# Patient Record
Sex: Female | Born: 1950 | Race: White | Hispanic: No | Marital: Married | Smoking: Never smoker
Health system: Southern US, Community
[De-identification: ages and names within clinical notes are randomized; demographics above are authoritative.]

## PROBLEM LIST (undated history)

## (undated) DIAGNOSIS — F329 Major depressive disorder, single episode, unspecified: Secondary | ICD-10-CM

## (undated) DIAGNOSIS — H269 Unspecified cataract: Secondary | ICD-10-CM

## (undated) DIAGNOSIS — K5900 Constipation, unspecified: Secondary | ICD-10-CM

## (undated) DIAGNOSIS — Z860100 Personal history of colon polyps, unspecified: Secondary | ICD-10-CM

## (undated) DIAGNOSIS — M62838 Other muscle spasm: Secondary | ICD-10-CM

## (undated) DIAGNOSIS — D649 Anemia, unspecified: Secondary | ICD-10-CM

## (undated) DIAGNOSIS — G971 Other reaction to spinal and lumbar puncture: Secondary | ICD-10-CM

## (undated) DIAGNOSIS — M549 Dorsalgia, unspecified: Secondary | ICD-10-CM

## (undated) DIAGNOSIS — R51 Headache: Secondary | ICD-10-CM

## (undated) DIAGNOSIS — R112 Nausea with vomiting, unspecified: Secondary | ICD-10-CM

## (undated) DIAGNOSIS — Z8619 Personal history of other infectious and parasitic diseases: Secondary | ICD-10-CM

## (undated) DIAGNOSIS — Z9889 Other specified postprocedural states: Secondary | ICD-10-CM

## (undated) DIAGNOSIS — F419 Anxiety disorder, unspecified: Secondary | ICD-10-CM

## (undated) DIAGNOSIS — F32A Depression, unspecified: Secondary | ICD-10-CM

## (undated) DIAGNOSIS — Z9289 Personal history of other medical treatment: Secondary | ICD-10-CM

## (undated) DIAGNOSIS — M1990 Unspecified osteoarthritis, unspecified site: Secondary | ICD-10-CM

## (undated) DIAGNOSIS — J302 Other seasonal allergic rhinitis: Secondary | ICD-10-CM

## (undated) DIAGNOSIS — R519 Headache, unspecified: Secondary | ICD-10-CM

## (undated) DIAGNOSIS — G8929 Other chronic pain: Secondary | ICD-10-CM

## (undated) HISTORY — PX: COLPOSCOPY: SHX161

## (undated) HISTORY — PX: SPINE SURGERY: SHX786

## (undated) HISTORY — PX: COLONOSCOPY: SHX174

## (undated) HISTORY — PX: ESOPHAGOGASTRODUODENOSCOPY: SHX1529

---
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
## 1977-03-08 DIAGNOSIS — G971 Other reaction to spinal and lumbar puncture: Secondary | ICD-10-CM

## 1977-03-08 HISTORY — DX: Other reaction to spinal and lumbar puncture: G97.1

## 1979-03-09 HISTORY — PX: TUBAL LIGATION: SHX77

## 1983-03-09 HISTORY — PX: ANAL FISSURE REPAIR: SHX2312

## 1998-02-18 ENCOUNTER — Encounter: Payer: Self-pay | Admitting: Internal Medicine

## 1998-02-18 ENCOUNTER — Ambulatory Visit (HOSPITAL_COMMUNITY): Admission: RE | Admit: 1998-02-18 | Discharge: 1998-02-18 | Payer: Self-pay | Admitting: Internal Medicine

## 1998-05-14 ENCOUNTER — Ambulatory Visit (HOSPITAL_COMMUNITY): Admission: RE | Admit: 1998-05-14 | Discharge: 1998-05-14 | Payer: Self-pay | Admitting: Gastroenterology

## 1998-09-26 ENCOUNTER — Ambulatory Visit (HOSPITAL_COMMUNITY): Admission: RE | Admit: 1998-09-26 | Discharge: 1998-09-26 | Payer: Self-pay | Admitting: Internal Medicine

## 1999-01-22 ENCOUNTER — Encounter: Admission: RE | Admit: 1999-01-22 | Discharge: 1999-01-22 | Payer: Self-pay | Admitting: Internal Medicine

## 1999-01-22 ENCOUNTER — Encounter: Payer: Self-pay | Admitting: Internal Medicine

## 1999-12-11 ENCOUNTER — Encounter: Admission: RE | Admit: 1999-12-11 | Discharge: 1999-12-11 | Payer: Self-pay | Admitting: Sports Medicine

## 2000-12-16 ENCOUNTER — Encounter: Payer: Self-pay | Admitting: Internal Medicine

## 2000-12-16 ENCOUNTER — Ambulatory Visit (HOSPITAL_COMMUNITY): Admission: RE | Admit: 2000-12-16 | Discharge: 2000-12-16 | Payer: Self-pay | Admitting: Internal Medicine

## 2001-07-25 ENCOUNTER — Encounter: Admission: RE | Admit: 2001-07-25 | Discharge: 2001-07-25 | Payer: Self-pay | Admitting: Internal Medicine

## 2001-08-08 ENCOUNTER — Encounter: Payer: Self-pay | Admitting: Internal Medicine

## 2001-08-08 ENCOUNTER — Encounter: Admission: RE | Admit: 2001-08-08 | Discharge: 2001-08-08 | Payer: Self-pay | Admitting: Internal Medicine

## 2002-05-04 ENCOUNTER — Encounter: Payer: Self-pay | Admitting: Internal Medicine

## 2002-05-04 ENCOUNTER — Ambulatory Visit (HOSPITAL_COMMUNITY): Admission: RE | Admit: 2002-05-04 | Discharge: 2002-05-04 | Payer: Self-pay | Admitting: Internal Medicine

## 2002-09-18 ENCOUNTER — Encounter: Payer: Self-pay | Admitting: Internal Medicine

## 2002-09-18 ENCOUNTER — Ambulatory Visit (HOSPITAL_COMMUNITY): Admission: RE | Admit: 2002-09-18 | Discharge: 2002-09-18 | Payer: Self-pay | Admitting: Internal Medicine

## 2003-10-15 ENCOUNTER — Encounter: Admission: RE | Admit: 2003-10-15 | Discharge: 2003-10-15 | Payer: Self-pay | Admitting: Internal Medicine

## 2003-10-16 ENCOUNTER — Ambulatory Visit: Admission: RE | Admit: 2003-10-16 | Discharge: 2003-10-16 | Payer: Self-pay | Admitting: Internal Medicine

## 2003-10-18 ENCOUNTER — Encounter: Admission: RE | Admit: 2003-10-18 | Discharge: 2003-10-18 | Payer: Self-pay | Admitting: Internal Medicine

## 2003-11-01 ENCOUNTER — Encounter: Admission: RE | Admit: 2003-11-01 | Discharge: 2003-11-01 | Payer: Self-pay | Admitting: Internal Medicine

## 2003-11-15 ENCOUNTER — Encounter: Admission: RE | Admit: 2003-11-15 | Discharge: 2004-02-13 | Payer: Self-pay | Admitting: Neurological Surgery

## 2003-11-18 ENCOUNTER — Encounter: Admission: RE | Admit: 2003-11-18 | Discharge: 2003-11-18 | Payer: Self-pay | Admitting: Internal Medicine

## 2004-01-07 ENCOUNTER — Inpatient Hospital Stay (HOSPITAL_COMMUNITY): Admission: RE | Admit: 2004-01-07 | Discharge: 2004-01-10 | Payer: Self-pay | Admitting: Neurological Surgery

## 2004-11-05 ENCOUNTER — Ambulatory Visit: Payer: Self-pay | Admitting: Internal Medicine

## 2005-02-12 ENCOUNTER — Ambulatory Visit (HOSPITAL_COMMUNITY): Admission: RE | Admit: 2005-02-12 | Discharge: 2005-02-12 | Payer: Self-pay | Admitting: Gastroenterology

## 2005-02-12 ENCOUNTER — Encounter (INDEPENDENT_AMBULATORY_CARE_PROVIDER_SITE_OTHER): Payer: Self-pay | Admitting: Specialist

## 2005-11-29 ENCOUNTER — Encounter: Admission: RE | Admit: 2005-11-29 | Discharge: 2005-11-29 | Payer: Self-pay | Admitting: Occupational Medicine

## 2007-01-30 ENCOUNTER — Encounter: Admission: RE | Admit: 2007-01-30 | Discharge: 2007-01-30 | Payer: Self-pay | Admitting: Internal Medicine

## 2008-02-27 ENCOUNTER — Encounter: Admission: RE | Admit: 2008-02-27 | Discharge: 2008-02-27 | Payer: Self-pay | Admitting: Internal Medicine

## 2008-12-26 ENCOUNTER — Ambulatory Visit (HOSPITAL_COMMUNITY): Admission: RE | Admit: 2008-12-26 | Discharge: 2008-12-26 | Payer: Self-pay | Admitting: Orthopedic Surgery

## 2009-12-10 ENCOUNTER — Emergency Department (HOSPITAL_COMMUNITY): Admission: EM | Admit: 2009-12-10 | Discharge: 2009-12-10 | Payer: Self-pay | Admitting: Family Medicine

## 2010-03-03 ENCOUNTER — Ambulatory Visit: Payer: Self-pay | Admitting: Internal Medicine

## 2010-03-03 DIAGNOSIS — F329 Major depressive disorder, single episode, unspecified: Secondary | ICD-10-CM

## 2010-03-03 DIAGNOSIS — E039 Hypothyroidism, unspecified: Secondary | ICD-10-CM | POA: Insufficient documentation

## 2010-03-03 DIAGNOSIS — E785 Hyperlipidemia, unspecified: Secondary | ICD-10-CM

## 2010-03-03 DIAGNOSIS — M8580 Other specified disorders of bone density and structure, unspecified site: Secondary | ICD-10-CM

## 2010-03-03 DIAGNOSIS — D509 Iron deficiency anemia, unspecified: Secondary | ICD-10-CM | POA: Insufficient documentation

## 2010-03-03 DIAGNOSIS — R7309 Other abnormal glucose: Secondary | ICD-10-CM

## 2010-03-03 DIAGNOSIS — G4700 Insomnia, unspecified: Secondary | ICD-10-CM

## 2010-03-03 DIAGNOSIS — J309 Allergic rhinitis, unspecified: Secondary | ICD-10-CM

## 2010-03-03 LAB — CONVERTED CEMR LAB
BUN: 9 mg/dL (ref 6–23)
Basophils Absolute: 0 10*3/uL (ref 0.0–0.1)
Basophils Relative: 1 % (ref 0–1)
CO2: 29 meq/L (ref 19–32)
Calcium: 10 mg/dL (ref 8.4–10.5)
Chloride: 104 meq/L (ref 96–112)
Creatinine, Ser: 0.7 mg/dL (ref 0.40–1.20)
Eosinophils Absolute: 0.1 10*3/uL (ref 0.0–0.7)
Eosinophils Relative: 2 % (ref 0–5)
Ferritin: 14 ng/mL (ref 10–291)
Free T4: 0.97 ng/dL (ref 0.80–1.80)
Glucose, Bld: 96 mg/dL (ref 70–99)
HCT: 37.1 % (ref 36.0–46.0)
Hemoglobin: 12.1 g/dL (ref 12.0–15.0)
Hgb A1c MFr Bld: 6 %
Lymphocytes Relative: 41 % (ref 12–46)
Lymphs Abs: 1.5 10*3/uL (ref 0.7–4.0)
MCHC: 32.6 g/dL (ref 30.0–36.0)
MCV: 88.3 fL (ref 78.0–100.0)
Monocytes Absolute: 0.4 10*3/uL (ref 0.1–1.0)
Monocytes Relative: 9 % (ref 3–12)
Neutro Abs: 1.8 10*3/uL (ref 1.7–7.7)
Neutrophils Relative %: 47 % (ref 43–77)
Platelets: 235 10*3/uL (ref 150–400)
Potassium: 4.8 meq/L (ref 3.5–5.3)
RBC: 4.2 M/uL (ref 3.87–5.11)
RDW: 14.9 % (ref 11.5–15.5)
Sodium: 142 meq/L (ref 135–145)
TSH: 3.174 microintl units/mL (ref 0.350–4.50)
Vit D, 25-Hydroxy: 41 ng/mL (ref 30–89)
Vitamin B-12: 554 pg/mL (ref 211–911)
WBC: 3.8 10*3/uL — ABNORMAL LOW (ref 4.0–10.5)

## 2010-03-10 ENCOUNTER — Encounter: Payer: Self-pay | Admitting: Internal Medicine

## 2010-03-12 ENCOUNTER — Ambulatory Visit (HOSPITAL_COMMUNITY)
Admission: RE | Admit: 2010-03-12 | Discharge: 2010-03-12 | Payer: Self-pay | Source: Home / Self Care | Attending: Internal Medicine | Admitting: Internal Medicine

## 2010-03-25 ENCOUNTER — Ambulatory Visit: Admission: RE | Admit: 2010-03-25 | Discharge: 2010-03-25 | Payer: Self-pay | Source: Home / Self Care

## 2010-03-25 ENCOUNTER — Encounter: Payer: Self-pay | Admitting: Internal Medicine

## 2010-03-25 LAB — CONVERTED CEMR LAB: Total CHOL/HDL Ratio: 3.3

## 2010-03-29 ENCOUNTER — Encounter: Payer: Self-pay | Admitting: Internal Medicine

## 2010-04-09 NOTE — Miscellaneous (Signed)
Summary: PATIENT CONSENT FORM  PATIENT CONSENT FORM   Imported By: Louretta Parma 03/05/2010 16:35:03  _____________________________________________________________________  External Attachment:    Type:   Image     Comment:   External Document

## 2010-04-09 NOTE — Assessment & Plan Note (Signed)
Summary: NEW PT/DS   Vital Signs:  Patient profile:   60 year old female Height:      64.5 inches Weight:      159.5 pounds BMI:     27.05 Temp:     98.1 degrees F oral Pulse rate:   68 / minute BP sitting:   155 / 67  (right arm)  Vitals Entered By: Filomena Jungling NT II (03/17/10 1:28 PM) Is Patient Diabetic? No Pain Assessment Patient in pain? no      Nutritional Status BMI of 25 - 29 = overweight  Have you ever been in a relationship where you felt threatened, hurt or afraid?No   Does patient need assistance? Functional Status Self care Ambulation Normal   History of Present Illness: Sharon Barrett is a 60 year old woman who is well known to me through the [facility] Teaching program who comes in today to establish care with me as her new PCP. She has several concerns triggered by her [facility] health screen and would like to have them evaluated. She has few specific or worrisome complaints today that need to be addressed other than insomnia, mild chronic rhinitis, medication clarifications and preventive care needs.  Preventive Screening-Counseling & Management  Alcohol-Tobacco     Smoking Status: never  Current Medications (verified): 1)  Venlafaxine Hcl 37.5 Mg Xr24h-Cap (Venlafaxine Hcl) .... Take 1 Tablet By Mouth Once A Day 2)  Claritin-D 24 Hour 10-240 Mg Xr24h-Tab (Loratadine-Pseudoephedrine) .... Take 1 Tablet By Mouth Once A Day As Needed For Congestion 3)  Colace 100 Mg Caps (Docusate Sodium) .... Take 1 Tablet By Mouth Two Times A Day As Needed For Constipation 4)  Aspirin 81 Mg Tabs (Aspirin) .... Take 1 Tablet By Mouth Once A Day 5)  Omega-3 Cf 1000 Mg Caps (Omega-3 Fatty Acids) .... 1200mg  Tablet By Mouth Once Daily 6)  Flonase 50 Mcg/act Susp (Fluticasone Propionate) .... One Spray in Each Nostril Twice Daily As Needed For Congestion 7)  Os-Cal 500 + D 500-200 Mg-Unit Tabs (Calcium Carbonate-Vitamin D) .... Take 1 Tablet By Mouth Two Times A Day 8)  Klonopin 0.5  Mg Tabs (Clonazepam) .... Take 1 Tablet By Mouth Once A Day At Bedtime As Needed For Sleep 9)  Simvastatin 20 Mg Tabs (Simvastatin) .... Take 1 Tablet By Mouth Once A Day At Bedtime  Allergies (verified): 1)  ! 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 [employer] Faith Lives in [place] with her husband Three children  Past Medical History: Lumbar Fusion 1993 for Unstable Spondylolisthesis Anal Fissure with complicated peri-rectal abscess 1985 Bladder Stricture in 1968, repaired Colposcopy for Abnormal PAP in 1980's Three Normal Childbirth/Deliveries Tonsillectomy in 1956  Past Surgical History: see PMH  Family History: Father died at 80 Heart Attack Mother died 98 Azhiemer Disease, Hypertension Siblings 2 older sisters: Sarcoidosis 2 Aunts with Lung Cancer  Social History: Never smoker No ETOH- occasionally No illicit drugt history 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   Head:  Normocephalic and atraumatic without obvious abnormalities. No apparent alopecia or balding. Eyes:  vision grossly intact.   Nose:  no external deformity and no nasal discharge.   Mouth:  good dentition and pharynx pink and moist.   Neck:  supple, no masses, no thyromegaly, no thyroid nodules or tenderness, no JVD, no carotid bruits, and no cervical  lymphadenopathy.   Chest Wall:  no deformities.   Lungs:  normal respiratory effort, normal breath sounds, and no wheezes.   Heart:  normal rate, regular rhythm, no murmur, no gallop, no rub, no JVD, and PMI normal.   Abdomen:  soft and non-tender.   Msk:  no joint tenderness and no joint swelling.   Pulses:  R radial normal, R dorsalis pedis normal, L radial normal, and L posterior tibial normal.   Extremities:  No clubbing, cyanosis, edema, or deformity noted with normal full range of motion of all joints.   Neurologic:  No cranial nerve deficits noted. Station and gait are normal. Plantar reflexes are down-going bilaterally. Sensory, motor and coordinative functions appear intact. Skin:  color normal and no rashes.   Cervical Nodes:  no anterior cervical adenopathy.   Psych:  Oriented X3, memory intact for recent and remote, normally interactive, and good eye contact.     Impression & Recommendations:  Problem # 1:  DEPRESSION (ICD-311) Will continue her current medications. No known side effects. Discussed current state of depression which she believes is well controlled. Some issues with insomnia discussed below.  Her updated medication list for this problem includes:    Venlafaxine Hcl 37.5 Mg Xr24h-cap (Venlafaxine hcl) .Marland Kitchen... Take 1 tablet by mouth once a day    Klonopin 0.5 Mg Tabs (Clonazepam) .Marland Kitchen... Take 1 tablet by mouth once a day at bedtime as needed for sleep  Problem # 2:  INSOMNIA (ICD-780.52) Some concerns today about chronic insomnia. Primary difficulty with sleep maintence and sometimes with sleep initation- feels it is strongly related to anxiety and "racing thoughts" -feels internal restlesness and cannot calm her mind to achieve sleep. We discussed sleep hygiene-which she has a very good understand of this. I also shared with her a concern about taking nasal decongestants with Psuedophed in them which is a stimulant and may be contributing to the restlessness. We will try  low dose klonopin at bedtiome and see if this helps for now. If she continues to have issues I will probably recommend a sleepy study.  Problem # 3:  ANEMIA (ICD-285.9) Will repeat CBC, slightly low on her health screen. She is also due for her colonoscopy- will have her set this up with Dr. Loreta Ave for later in January. Will also check B12 and ferritin.  Orders: T-CBC w/Diff [phone]) T-Ferritin [phone]) T-Vitamin B12 ([id number])  Problem # 4:  FASTING HYPERGLYCEMIA (ICD-790.29) Again, slightly high on health screen-will check an A1C to make sure she doesnt have pre-DM.  Orders: T-Basic Metabolic Panel [phone]) T- Hemoglobin A1C ([id number])  Problem # 5:  HYPOTHYROIDISM (ICD-244.9) TSH elevated on health screen- will repeat and check a T4.  Orders: T-T4, Free [id number]) T-TSH [phone])  Problem # 6:  ALLERGIC RHINITIS (ICD-477.9) Encouraged her to reduce her use of OTC "D" drugs ok to use regular Zyrtec. Continue with Flonase.   Her updated medication list for this problem includes:    Flonase 50 Mcg/act Susp (Fluticasone propionate) ..... One spray in each nostril twice  daily as needed for congestion  Problem # 7:  OSTEOPENIA (ICD-733.90) Will check her Vitamin D level. Will order DEXA at next visit- she reports a DEXA within the last 5 years.  Orders: T-Vitamin D (25-Hydroxy) [id number])  Problem # 8:  Preventive Health Care (ICD-V70.0) Due for mammogram. Will place order. Await labs and I will update flowsheet.  Problem # 9:  HYPERLIPIDEMIA (ICD-272.4) LDL 135 on health screen, we discussed the benefits of a statin and will start today. Will check LFTs in 6-8 weeks.  Her updated medication list for this problem includes:    Simvastatin 20 Mg Tabs (Simvastatin) .Marland Kitchen... Take 1 tablet by mouth once a day at bedtime  Complete Medication List: 1)  Venlafaxine Hcl 37.5 Mg Xr24h-cap (Venlafaxine hcl) .... Take 1 tablet by mouth once a day 2)   Claritin-d 24 Hour 10-240 Mg Xr24h-tab (Loratadine-pseudoephedrine) .... Take 1 tablet by mouth once a day as needed for congestion 3)  Colace 100 Mg Caps (Docusate sodium) .... Take 1 tablet by mouth two times a day as needed for constipation 4)  Aspirin 81 Mg Tabs (Aspirin) .... Take 1 tablet by mouth once a day 5)  Omega-3 Cf 1000 Mg Caps (Omega-3 fatty acids) .... 1200mg  tablet by mouth once daily 6)  Flonase 50 Mcg/act Susp (Fluticasone propionate) .... One spray in each nostril twice daily as needed for congestion 7)  Os-cal 500 + D 500-200 Mg-unit Tabs (Calcium carbonate-vitamin d) .... Take 1 tablet by mouth two times a day 8)  Klonopin 0.5 Mg Tabs (Clonazepam) .... Take 1 tablet by mouth once a day at bedtime as needed for sleep 9)  Simvastatin 20 Mg Tabs (Simvastatin) .... Take 1 tablet by mouth once a day at bedtime  Other Orders: Mammogram (Screening) (Mammo)  Patient Instructions: 1)  I will call you to discuss lab results. 2)  F/U in 6 months or as needed. Prescriptions: SIMVASTATIN 20 MG TABS (SIMVASTATIN) Take 1 tablet by mouth once a day at bedtime  #30 x 0   Entered and Authorized by:   Julaine Fusi  DO   Signed by:   Julaine Fusi  DO on 03/03/2010   Method used:   Electronically to        [facility]* (retail)       [address]       [address] Shipping/mailing       [place], [place]  [address]       Ph: [phone]       Fax: [phone]   RxID:   [id number] KLONOPIN 0.5 MG TABS (CLONAZEPAM) Take 1 tablet by mouth once a day at bedtime as needed for sleep  #30 x 0   Entered and Authorized by:   Julaine Fusi  DO   Signed by:   Julaine Fusi  DO on 03/03/2010   Method used:   Print then Give to Patient   RxID:   [id number]    Orders Added: 1)  T-Basic Metabolic Panel [[id number]] 2)  T-CBC w/Diff [[id number]] 3)  T- Hemoglobin A1C [83036-23375] 4)  T-T4, Free [[id number]] 5)  T-TSH [[id number]] 6)  T-Ferritin  [[id number]] 7)  T-Vitamin B12 [82607-23330] 8)  T-Vitamin D (25-Hydroxy) [76283-15176] 9)  Mammogram (Screening) [Mammo] 10)  New Patient Level V [99205]    Process Orders Check Orders Results:     [facility]: ABN not required for this insurance Tests Sent for requisitioning (March 08, 2010 10:50 PM):  03/03/2010: [facility] -- T-Basic Metabolic Panel [phone] (signed)     03/03/2010: [facility] -- T-CBC w/Diff [[id number]] (signed)     03/03/2010: [facility] -- T- Hemoglobin A1C [[id number]] (signed)     03/03/2010: [facility] -- T-T4, [place] [[id number]] (signed)     03/03/2010: [facility] -- T-TSH [phone] (signed)     03/03/2010: [facility] -- T-Ferritin [phone] (signed)     03/03/2010: [facility] -- T-Vitamin B12 [phone] (signed)     03/03/2010: [facility] -- T-Vitamin D (25-Hydroxy) [phone] (signed)     Prevention & Chronic Care Immunizations   Influenza vaccine: Not documented    Tetanus booster: Not documented    Pneumococcal vaccine: Not documented    H. zoster vaccine: Not documented    Immunization comments: PNA done at 60, shingles vac at 58  Colorectal Screening   Hemoccult: Not documented    Colonoscopy: Not documented  Other Screening   Pap smear: Not documented   Pap smear action/deferral: Not indicated-other  (03/03/2010)    Mammogram: Not documented   Mammogram action/deferral: Ordered  (03/03/2010)    DXA bone density scan: Not documented   Smoking status: never  (03/03/2010)  Lipids   Total Cholesterol: Not documented   LDL: Not documented   LDL Direct: Not documented   HDL: Not documented   Triglycerides: Not documented    SGOT (AST): Not documented   SGPT (ALT): Not documented   Alkaline phosphatase: Not documented   Total bilirubin: Not  documented  Self-Management Support :    Lipid self-management support: Not documented    Nursing Instructions: Schedule screening mammogram (see order)   Laboratory Results   Blood Tests   Date/Time Received: March 03, 2010 3:08 PM Date/Time Reported: Burke Keels  March 03, 2010 3:08 PM   HGBA1C: 6.0%   (Normal Range: Non-Diabetic - 3-6%   Control Diabetic - 6-8%)

## 2010-04-09 NOTE — Miscellaneous (Signed)
  Clinical Lists Changes  Orders: Added new Test order of T-Lipid Profile [phone]) - Signed     Process Orders Check Orders Results:     [facility]: ABN not required for this insurance Order queued for requisitioning for [facility]: March 10, 2010 8:09 PM  Tests Sent for requisitioning (March 19, 2010 10:43 PM):     03/10/2010: [facility] -- T-Lipid Profile [phone] (signed)

## 2010-04-14 ENCOUNTER — Encounter: Payer: Self-pay | Admitting: Internal Medicine

## 2010-04-14 ENCOUNTER — Other Ambulatory Visit: Payer: Self-pay | Admitting: *Deleted

## 2010-04-15 MED ORDER — CLONAZEPAM 0.5 MG PO TABS: 0.5000 mg | ORAL_TABLET | Freq: Two times a day (BID) | ORAL | Status: DC | PRN

## 2010-04-15 NOTE — Telephone Encounter (Signed)
Rx called in 

## 2010-04-16 ENCOUNTER — Telehealth: Payer: Self-pay | Admitting: *Deleted

## 2010-04-16 ENCOUNTER — Other Ambulatory Visit: Payer: Self-pay | Admitting: *Deleted

## 2010-04-16 NOTE — Telephone Encounter (Signed)
Pt called and ask if her refill had been called in for clonazepam, i [facility]

## 2010-04-16 NOTE — Telephone Encounter (Signed)
Med has been refilled.

## 2010-04-27 ENCOUNTER — Other Ambulatory Visit: Payer: Self-pay | Admitting: *Deleted

## 2010-04-27 MED ORDER — FLUTICASONE PROPIONATE 50 MCG/ACT NA SUSP: 1.0000 | Freq: Two times a day (BID) | NASAL | Status: DC | PRN

## 2010-05-12 ENCOUNTER — Other Ambulatory Visit: Payer: Self-pay | Admitting: Internal Medicine

## 2010-05-12 MED ORDER — CIPROFLOXACIN-HYDROCORTISONE 0.2-1 % OT SUSP: 4.0000 [drp] | Freq: Two times a day (BID) | OTIC | Status: AC

## 2010-05-12 NOTE — Progress Notes (Signed)
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 5 days of R ear pain, dry cough, tenderness over frontal sinuses, scratchy throat. No fever, dyspnea. Never gets sick. Had to leave work which she never has to do. Use OTC to treat sxs. Exam NAD, R TM erythematous over anterior canal, TM nl. No LAD. Throat nl. Lungs clear.   A/P Likely Viral URI with R external otitis. Cont symptomatic tx. Cipro ear drops.

## 2010-07-03 ENCOUNTER — Other Ambulatory Visit: Payer: Self-pay | Admitting: Gastroenterology

## 2010-07-03 ENCOUNTER — Ambulatory Visit (HOSPITAL_COMMUNITY)
Admission: RE | Admit: 2010-07-03 | Discharge: 2010-07-03 | Disposition: A | Discharge status: Home / Self Care | Payer: 59 | Source: Ambulatory Visit | Attending: Gastroenterology | Admitting: Gastroenterology

## 2010-07-03 DIAGNOSIS — K5520 Angiodysplasia of colon without hemorrhage: Secondary | ICD-10-CM | POA: Insufficient documentation

## 2010-07-03 DIAGNOSIS — D126 Benign neoplasm of colon, unspecified: Secondary | ICD-10-CM | POA: Insufficient documentation

## 2010-07-03 DIAGNOSIS — Z09 Encounter for follow-up examination after completed treatment for conditions other than malignant neoplasm: Secondary | ICD-10-CM | POA: Insufficient documentation

## 2010-07-03 DIAGNOSIS — K5900 Constipation, unspecified: Secondary | ICD-10-CM | POA: Insufficient documentation

## 2010-07-03 DIAGNOSIS — K5730 Diverticulosis of large intestine without perforation or abscess without bleeding: Secondary | ICD-10-CM | POA: Insufficient documentation

## 2010-07-03 DIAGNOSIS — K648 Other hemorrhoids: Secondary | ICD-10-CM | POA: Insufficient documentation

## 2010-07-24 NOTE — Op Note (Signed)
Sharon Barrett, Sharon Barrett                ACCOUNT NO.:  [id number]   MEDICAL RECORD NO.:  [id number]          PATIENT TYPE:  INP   LOCATION:  3013                         FACILITY:  [facility]   PHYSICIAN:  Stefani Dama, M.D.  DATE OF BIRTH:  [birth year]   DATE OF PROCEDURE:  01/07/2004  DATE OF DISCHARGE:                                 OPERATIVE REPORT   PREOPERATIVE DIAGNOSIS:  L4, L5 spondylolisthesis with stenosis and lumbar  radiculopathy.   POSTOPERATIVE DIAGNOSIS:  L4, L5 spondylolisthesis with stenosis and lumbar  radiculopathy.   OPERATION:  1.  Bilateral lumbar laminotomy and diskectomy, L4-5.  2.  Posterior lumbar interbody arthrodesis with PEEK interbody spacers,      allograft and autograft arthrodesis, and posterior fusion with autograft      and allograft, nonsegmental pedicular fixation, L4-5.   SURGEON:  Stefani Dama, M.D.   FIRST ASSISTANT:  Danae Orleans. Venetia Maxon, M.D.   ANESTHESIA:  General endotracheal.   INDICATIONS:  The patient is a 60 year old individual who has had  significant back and bilateral lower extremity pain, worse on the left side  than the right side.  She has evidence of a degenerative listhesis at the L4-  5 level, where she has had a previous diskectomy.  She is now to undergo  surgical decompression and stabilization at L4-5.   PROCEDURE:  The patient was brought to the operating room supine on the  stretcher.  After the smooth induction of general endotracheal anesthesia,  she was turned prone.  The back was shaved, prepped with Duraprep, and  draped in a sterile fashion.  The previously-made incision was reopened with  the midline incision, and this was carried down to the lumbar dorsal fascia,  which was opened on either side of midline to expose the spinous processes  of L3, L4, and L5.  A localizing radiograph was obtained with towel hooks  around the L3 and the L5 spinous processes.  Interlaminar space at L5-S1 was  then dissected down and  the muscle bellies were retracted out to either  side.  A self-retaining retractor was placed in the wound and care was taken  to then dissect the area around the facet joint at L5-S1.  Dissection was  carried up superiorly to expose the transverse processes of L4.  The  interlaminar space thus being decompressed, care was taken then to enlarge  the laminotomy at L4-5, first on the right side, thus exposing the common  dural tube and the L5 nerve root and protecting it carefully.  Epidural  fibrosis that was plentiful around this area was carefully dissected away  and divided so as to decompress the common dural tube and the L5 nerve root  on that right side.  The disk space was identified, and then the disk space  was opened with a 15 blade.  There was noted to be a spondylolisthetic  segment here approximately 4-5 mm of anterolisthesis of L4 on L5.  Care was  taken to dissect away the tissue to allow for further mobilization of the  vertebrae.  Once this was  accomplished, the procedure was repeated on the  left side.  Here there was noted to be substantially more spondylitic change  in the facet joint itself and this facet joint largely required to be  sacrificed in order to gain good decompression of the left-sided nerve  roots.  Once this was accomplished, hemostasis in the epidural spaces was  achieved.  Diskectomy was completed at L4-5 on that left side.  A series of  interbody spreaders was then used to distract the disk space so as to  increase the height of the disk space and also to allow for some reduction  of the spondylolisthetic segment.  A series of __________ type disk space  rongeurs was used to enlarge the interspace from initially the 6 mm up to  the 9 mm size.  This would allow for placement of 10mm graft under  significant compression.  The distraction was performed first on the left  and then carefully on the right side.  Ultimately the disk space was  instrumented  after removing all the degenerated disk material and  decorticating the end plates.  A box cutter was used on the left side to  shape the disk space.  Then a 10 mm PEEK cage spacer was filled with the  patient's own bone along with some allograft to distract the space at a 10  mm size.  This was first done on the right side and then repeated on the  left side.  Pedicle entry sites were then chosen at L4 and L5, and these  were first marked on the left side, then similarly were marked with pedicle  probes on the right side, radiographs being taken at each turn to check  their appropriate placement of the screws.  The screws were then placed by  tapping the holes with 6 mm tap and confirming that there was no evidence of  any cut-out by inspection with the ball-tip probe and once this was  ascertained, then a 6.5 x 45 mm screw was placed into each of the pedicle  holes at L4 and L5, first at the right side and then on the left side.  Once  this was secured, the posterior interspinous and laminar spaces were  decorticated.  Healos sponge was soaked with the patient's bone marrow  aspirate that was obtained from tapping the pedicles of L4 and L5 on each  side.  Once the sponge was placed over the interlaminar space, the construct  was then tightened down with pedicle screws being placed in compression  posteriorly at L4-5.  Care was then taken to make sure that the L4 and L5  nerve roots sounded freely out their foramen.  When this was ascertained,  the rest of the bone graft was laid in the posterior interspinous space at  L4-5.  With this being completed, the wound was irrigated copiously with  antibiotic irrigating solution.  The lumbar dorsal fascia was closed with #1  Vicryl, 2-0 Vicryl was used in the subcutaneous, and 3-0 Vicryl  subcuticularly.  The patient tolerated the procedure well and was returned  to the recovery room in stable condition.       HJE/MEDQ  D:  01/07/2004  T:   01/07/2004  Job:  [id number]

## 2010-07-24 NOTE — Discharge Summary (Signed)
[name], [name]                ACCOUNT NO.:  [id number]   MEDICAL RECORD NO.:  [id number]          PATIENT TYPE:  INP   LOCATION:  3013                         FACILITY:  [facility]   PHYSICIAN:  Stefani Dama, M.D.  DATE OF BIRTH:  09/11/1950   DATE OF ADMISSION:  01/07/2004  DATE OF DISCHARGE:  01/10/2004                                 DISCHARGE SUMMARY   ADMISSION DIAGNOSES:  1.  Lumbar spondylolisthesis with stenosis.  2.  Lumbar radiculopathy, L4-L5.   POSTOPERATIVE DIAGNOSES:  1.  Lumbar spondylolisthesis with stenosis.  2.  Lumbar radiculopathy, L4-L5.   PROCEDURE:  1.  Lumbar diskectomy, L4-L5.  2.  Posterior lumbar interbody arthrodesis.  3.  Pedicle screw fixation, L4-L5.  4.  Arthrodesis with allograft and autograft.   SURGEON:  Stefani Dama, M.D.   FIRST ASSISTANT:  Danae Orleans. Venetia Maxon, M.D.   CONDITION ON DISCHARGE:  Improving.   HOSPITAL COURSE:  Ms. Sharon Barrett is a 61 year old individual who has had a  previous bilateral diskectomy at the L4-L5 level some 12 years ago.  She did  well for a large number of years, but in the last year to two years has had  progressive increase in back pain with bilateral leg pain.  She has symptoms  of an L4 and L5 radiculopathy, secondary to degenerative anterolisthesis at  L4 and L5, including a significant central and lateral recess stenosis.  After careful consideration of her options, having failed efforts at  conservative management, she has been advised regarding surgical  decompression arthrodesis.  This was undertaken on January 07, 2004.   Postoperatively the patient was ambulated on the first postoperative day.  She remained on some PCA medication for the first 24 hr.  Thereafter this  was changed to oral medications and her ambulatory status was improved to a  point where a Foley catheter that was placed prior to the surgery could be  withdrawn.   DISCHARGE MEDICATIONS:  She continued to improve steadily,  using less and  less oral pain medications.  However, she is discharged with a prescription  for Percocet #60 without refills and Valium 5 mg #30 without refills.   FOLLOW UP:  She will be seen in the office for further followup in  approximately two weeks time.   CONDITION ON DISCHARGE:  At the time of discharge her condition is improving  and the incision remains clean and dry.      Henr   HJE/MEDQ  D:  02/13/2004  T:  02/14/2004  Job:  [id number]

## 2010-07-24 NOTE — Op Note (Signed)
Sharon Barrett, Sharon Barrett                ACCOUNT NO.:  [id number]   MEDICAL RECORD NO.:  [id number]          PATIENT TYPE:  AMB   LOCATION:  ENDO                         FACILITY:  [facility]   PHYSICIAN:  Anselmo Rod, M.D.  DATE OF BIRTH:  Jan 26, 1951   DATE OF PROCEDURE:  02/12/2005  DATE OF DISCHARGE:                                 OPERATIVE REPORT   PROCEDURE:  Colonoscopy with snare polypectomy x 1 and cold biopsies x 1.   ENDOSCOPIST:  Charna Elizabeth, M.D.   INSTRUMENT USED:  Olympus video colonoscope.   INDICATIONS FOR PROCEDURE:  60 year old white female undergoing screening  colonoscopy to rule out colonic polyps, masses, etc.   PREPROCEDURE PREPARATION:  Informed consent was obtained from the patient.  The patient was fasted for four hours prior to the procedure and prepped  with Osmo prep pills the night of and the morning of the procedure.  The  risks and benefits of the procedure including a 10% miss rate of cancer and  polyps were discussed with the patient, as well.   PREPROCEDURE PHYSICAL:  Patient with stable vital signs.  Neck supple.  Chest clear to auscultation.  S1 and S2 regular.  Abdomen soft with normal  bowel sounds.   DESCRIPTION OF PROCEDURE:  The patient was placed in the left lateral  decubitus position, sedated with 75 mcg Fentanyl and 7.5 mg Versed in slow  incremental doses.  Once the patient was adequately sedated, maintained on  low flow oxygen and continuous cardiac monitoring, the Olympus video  colonoscope was advanced from the rectum to the cecum.  The appendiceal  orifice and ileocecal valve were clearly visualized and photographed.  There  was a significant amount of residual stool in the colon, multiple washes  were done.  A small sessile polyp was snared from the rectosigmoid colon and  another one biopsied (cold biopsies x 1) from the rectosigmoid colon.  An  isolated small cecal AVM was noted, as well.  This was not bleeding and,  therefore,  no ablation was done.  The terminal ileum appeared normal and  without lesions.  Retroflexion in the rectum revealed small internal  hemorrhoids.  The patient tolerated the procedure well without immediate  complications.   IMPRESSION:  1.Two polyps removed from rectosigmoid colon, one by snare  polypectomy and one by cold biopsy.  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  3.Small internal hemorrhoids.  4.Some stool in the colon, multiple washes done.   RECOMMENDATIONS:  1.Await pathology results.  2.Repeat colonoscopy depending on pathology results.  3.Avoid non-steroidals including aspirin for the next two weeks.  4.Outpatient follow up as need arises in the future.  5.Further recommendations made once the biopsy results have been obtained.      Anselmo Rod, M.D.  Electronically Signed     JNM/MEDQ  D:  02/12/2005  T:  02/12/2005  Job:  [id number]   cc:   Alvester Morin, M.D.  Fax: [phone]

## 2010-07-24 NOTE — H&P (Signed)
NAMEKASEN, SAKO                ACCOUNT NO.:  [id number]   MEDICAL RECORD NO.:  [id number]          PATIENT TYPE:  INP   LOCATION:  2899                         FACILITY:  [facility]   PHYSICIAN:  Stefani Dama, M.D.  DATE OF BIRTH:  26-Nov-1950   DATE OF ADMISSION:  01/07/2004  DATE OF DISCHARGE:                                HISTORY & PHYSICAL   ADMITTING DIAGNOSES:  L4-L5 spondylolisthesis with lumbar radiculopathy and  back pain.   HISTORY OF PRESENT ILLNESS:  Ms. 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  She underwent surgical decompression via bilateral laminotomy and  diskectomy.  Postoperatively she had a gradual recovery and seemed to do  quite well.  Over the past year or so she has had a progressive increase in  the amount of back pain with radiation to both her hips and buttocks and  legs.  She notes that her tolerance to sitting has decreased severely to  where she can only tolerate sitting for 5-10 minutes before she has to get  up and move about.  Her increasing level of dysfunction has been  particularly problematic.  She finds it difficult to be comfortable for any  length of time in any given position, particularly with the symptoms that  she is experiencing mostly in her left lower extremity.  She has had a  couple of epidural steroid injections by Dr. Antionette Poles and neither one of  these has given her substantial relief.  An MRI demonstrated that she had  significant degeneration at L4-L5, particularly on the left side where she  had lateral and foraminal recess stenosis.   PAST MEDICAL HISTORY:  Her general health has been very good.  She denies  any substantial medical problems such as diabetes, high blood pressure,  liver, heart, or kidney disease.  She had recent foot surgery in May of this  year.  This was on her right foot.   REVIEW OF SYSTEMS:  Notable for wearing of  glasses, nasal congestion  secondary to allergies, leg weakness, back pain, leg pain which has been  noted in her history of present illness, depression.  She is currently  taking Effexor on a periodic basis.  She has inhalant nasal allergies noted  on the 14-point review sheet.   CURRENT MEDICATIONS:  1.  Effexor.  2.  Allegra D.  3.  Nasacort.  4.  Benadryl.  5.  She has been on Vicodin and more recently on Percocet for pain control      in addition to Darvocet-N and Ultracet.   ALLERGIES:  PENICILLIN which causes a rash and CATGUT also causes a reaction  on her.   PHYSICAL EXAMINATION:  GENERAL:  She is an alert, oriented, and cooperative  individual.  No overt distress.  She will stand straight and erect without  difficulty.  However, her back reveals a moderate amount of paravertebral  spasms with light palpation and percussion.  NEUROLOGIC:  Her motor strength is good in the lower extremities  in  iliopsoas, quadriceps, and tibialis anterior.  The gastrocnemii reveal 2+  reflexes in the patellae and the Achilles'.  Her straight leg raise  reproduces some leg pain in 60 degrees in either lower extremity.  Patrick's  maneuver is negative bilaterally.  Her gait does not reveal any antalgia  when observed in the office.  However, cranial nerve examination reveals her  pupils are 3 mm, briskly reactive to light and accommodation.  Extraocular  movements are full.  Face is symmetric.  Grimace, tongue, and uvula are in  the midline.  HEENT:  Sclerae and conjunctivae are clear.  NECK:  No masses.  No bruits are heard.  LUNGS:  Clear to auscultation.  HEART:  Regular rate and rhythm.  ABDOMEN:  Soft.  Bowel sounds are positive.  No masses are palpable.  EXTREMITIES:  No clubbing, cyanosis, edema.   IMPRESSION:  The patient has evidence of spondylitic degeneration with  spondylolisthesis at L4-L5 with some central and left-sided canal stenosis.  She is now being admitted to undergo  surgical decompression of her lumbar  spine.       HJE/MEDQ  D:  01/07/2004  T:  01/07/2004  Job:  [id number]

## 2010-07-31 ENCOUNTER — Encounter: Payer: Self-pay | Admitting: Internal Medicine

## 2010-10-15 ENCOUNTER — Other Ambulatory Visit: Payer: Self-pay | Admitting: *Deleted

## 2010-10-15 MED ORDER — CLONAZEPAM 0.5 MG PO TABS: 0.5000 mg | ORAL_TABLET | Freq: Two times a day (BID) | ORAL | Status: DC | PRN

## 2010-10-15 NOTE — Telephone Encounter (Signed)
Refill for Clonazepam 0.5 mg # 60 with 1 refill called to the [facility] per order of Dr. Coralee Pesa.

## 2010-10-20 IMAGING — MG MM DIGITAL SCREENING BILAT W/ CAD
4 series · 4 of 4 positions shown · non-contrast
Comparison: Prior studies.

DG SCREEN MAMMOGRAM BILATERAL
Bilateral CC and MLO view(s) were taken.
Prior study comparison: August 08, 2001, bilateral diagnostic mammogram.

DIGITAL SCREENING MAMMOGRAM WITH CAD:

[R CC]
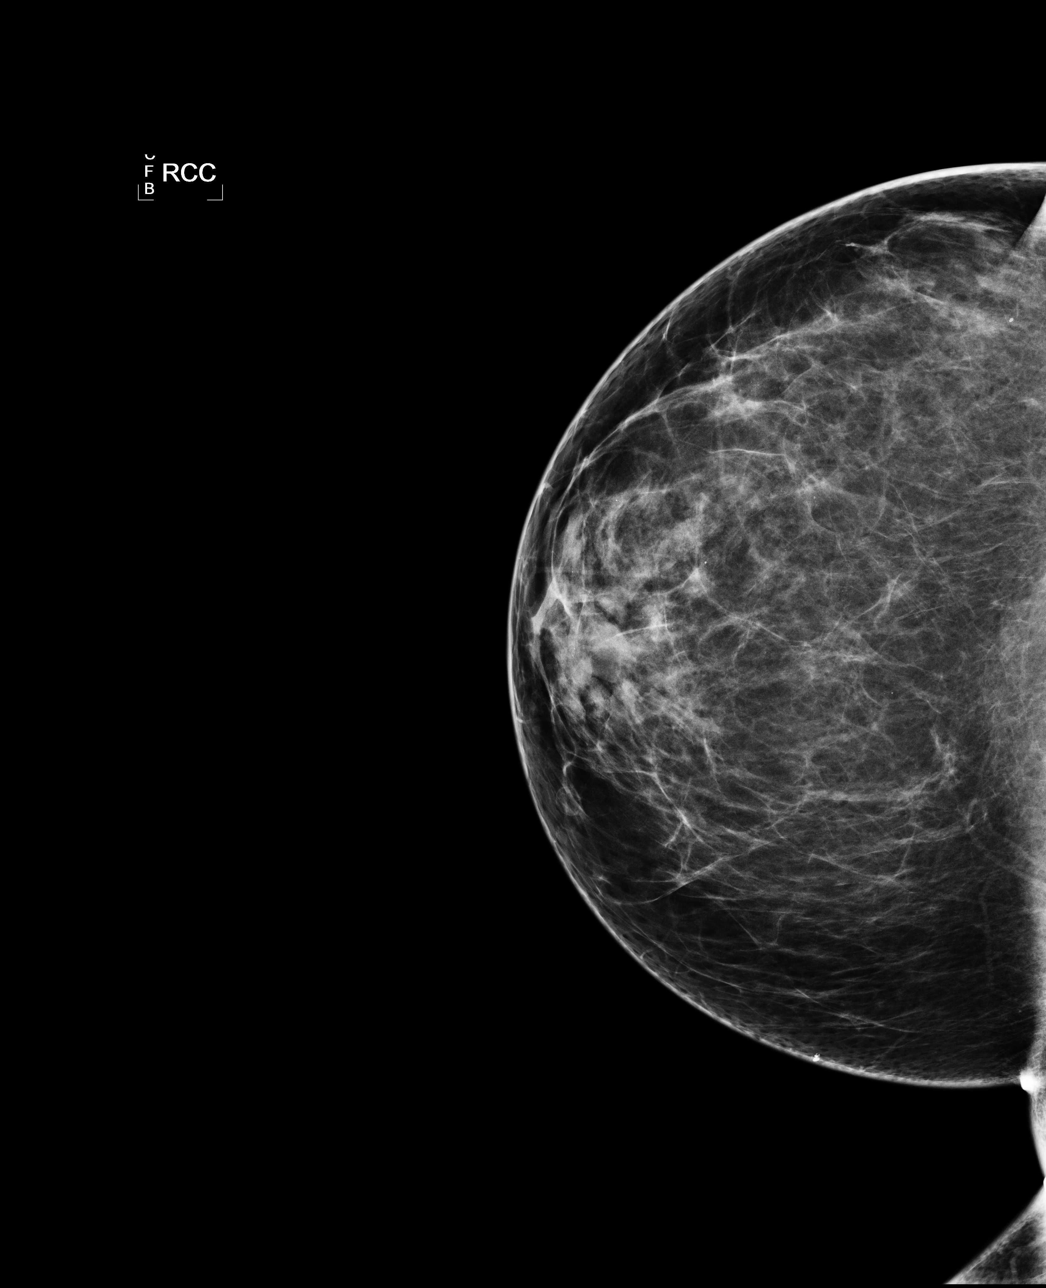

[L CC]
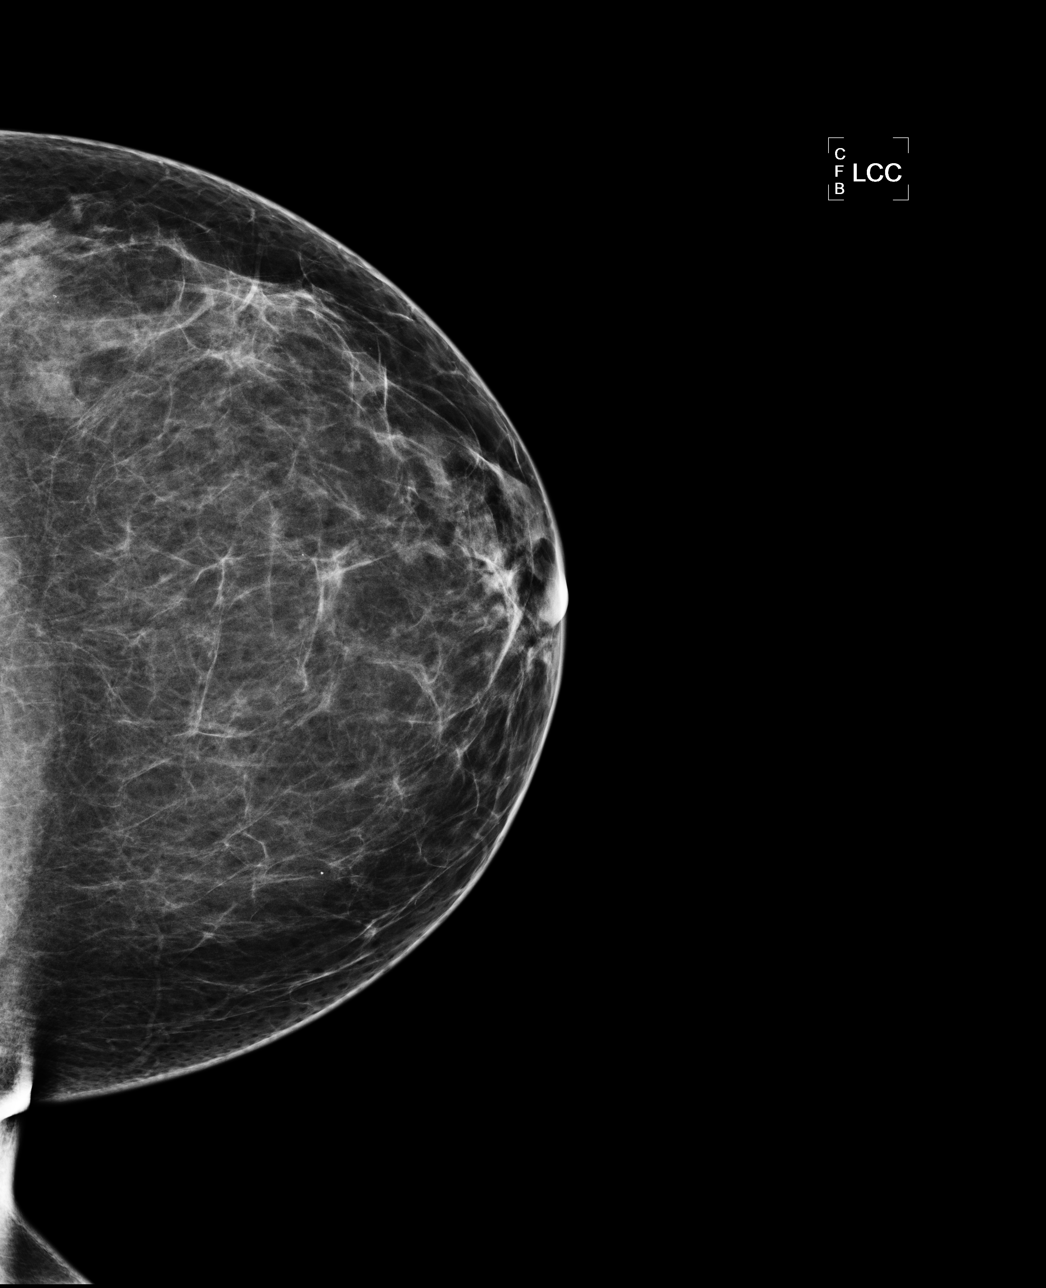

[L MLO]
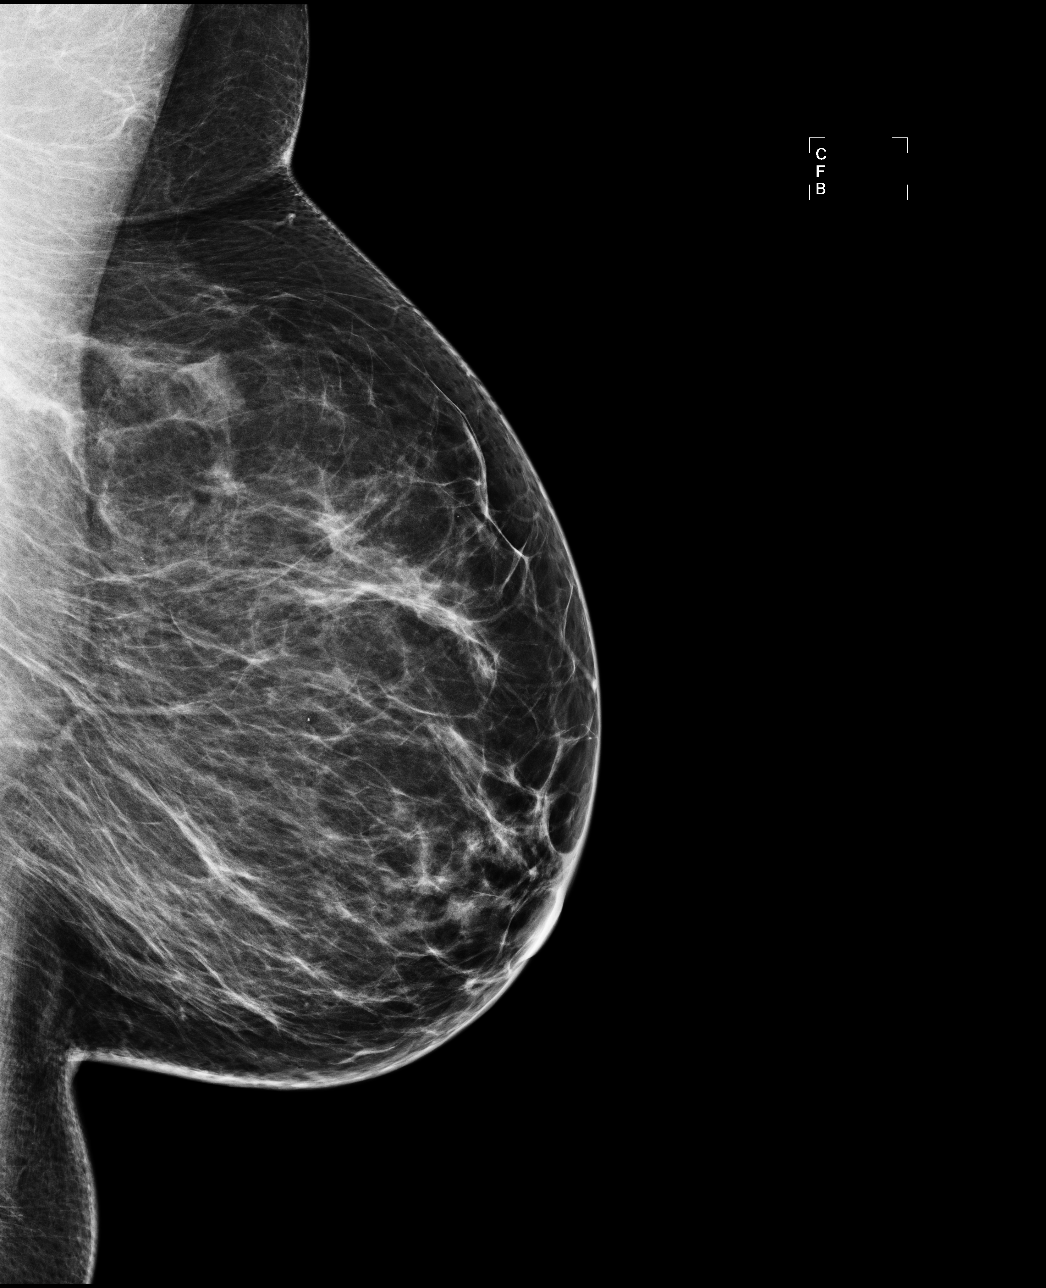

[R MLO]
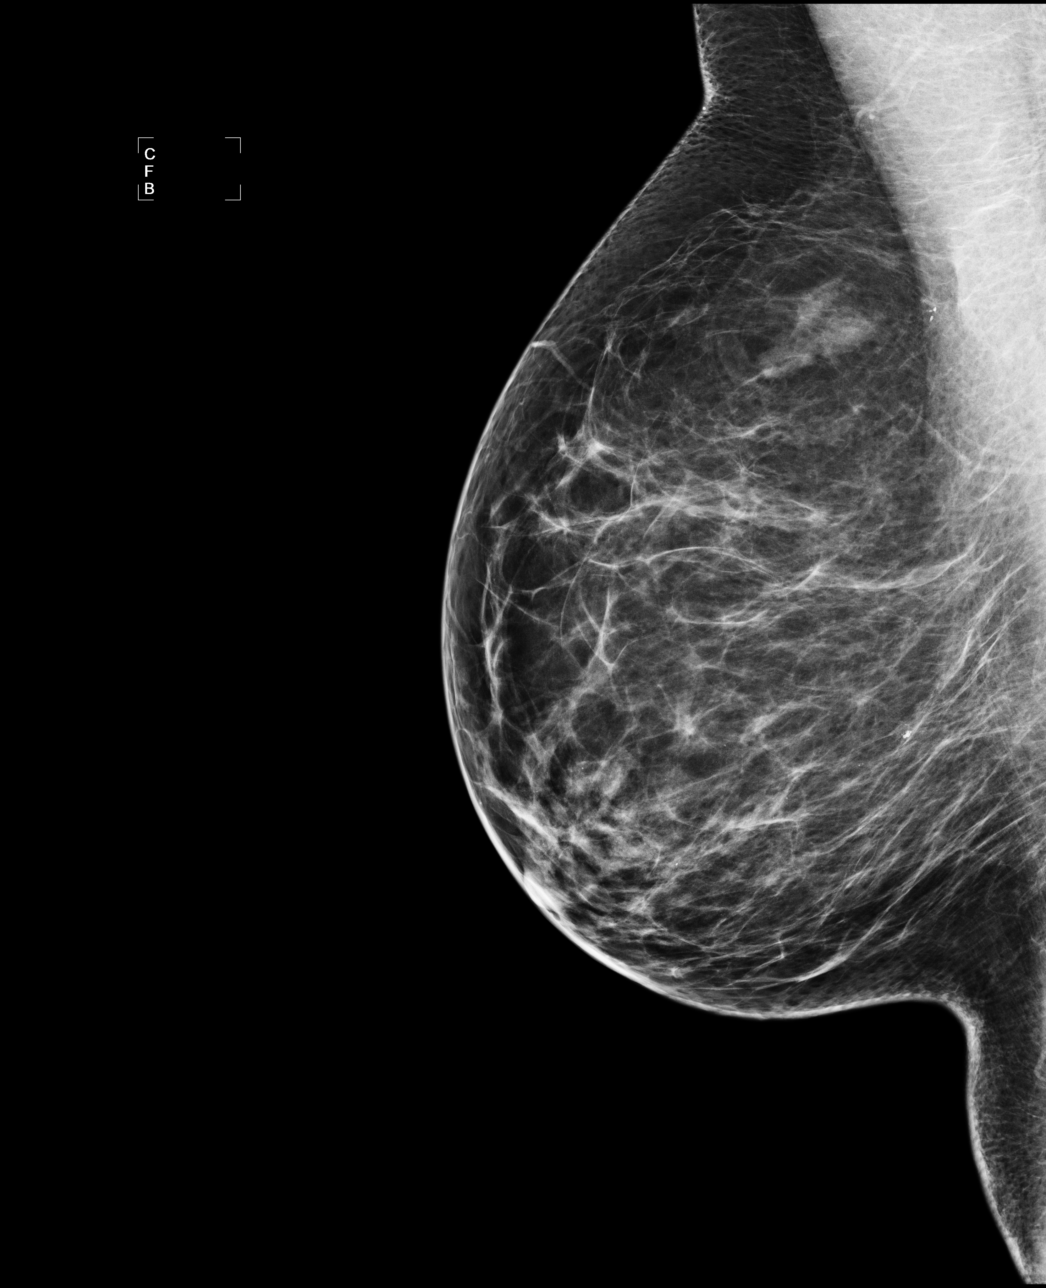

[4 of 4 positions shown; findings below may reference images not displayed]

The breast tissue is almost entirely fatty.  There is no dominant mass, architectural distortion or
calcification to suggest malignancy.
IMPRESSION: No mammographic evidence of malignancy.  Suggest yearly screening mammography.

ASSESSMENT: Negative - BI-RADS 1

Screening mammogram in 1 year.
ANALYZED BY COMPUTER AIDED DETECTION. , THIS PROCEDURE WAS A DIGITAL MAMMOGRAM.

## 2011-02-18 ENCOUNTER — Other Ambulatory Visit: Payer: Self-pay | Admitting: Internal Medicine

## 2011-02-19 NOTE — Telephone Encounter (Signed)
Clonazepam rx called to [facility] pharmacy.

## 2011-03-18 ENCOUNTER — Other Ambulatory Visit: Payer: Self-pay | Admitting: Internal Medicine

## 2011-03-18 MED ORDER — ASPIRIN 81 MG PO TABS: 81.0000 mg | ORAL_TABLET | Freq: Every day | ORAL | Status: DC

## 2011-03-18 MED ORDER — FLUTICASONE PROPIONATE 50 MCG/ACT NA SUSP: 1.0000 | Freq: Two times a day (BID) | NASAL | Status: DC | PRN

## 2011-03-18 MED ORDER — CLONAZEPAM 0.5 MG PO TABS: 0.5000 mg | ORAL_TABLET | Freq: Two times a day (BID) | ORAL | Status: DC | PRN

## 2011-03-18 NOTE — Progress Notes (Signed)
Called in to [facility]

## 2011-03-19 ENCOUNTER — Other Ambulatory Visit: Payer: Self-pay | Admitting: Internal Medicine

## 2011-03-19 MED ORDER — OXYMETAZOLINE HCL 0.05 % NA SOLN: 2.0000 | Freq: Two times a day (BID) | NASAL | Status: DC

## 2011-03-19 MED ORDER — PHENYLEPHRINE HCL 10 MG PO TABS: 10.0000 mg | ORAL_TABLET | ORAL | Status: DC | PRN

## 2011-03-19 MED ORDER — VITAMIN D3 50 MCG (2000 UT) PO CAPS: 2000.0000 [IU] | ORAL_CAPSULE | Freq: Every day | ORAL | Status: DC

## 2011-03-19 MED ORDER — DOCUSATE SODIUM 100 MG PO CAPS: 200.0000 mg | ORAL_CAPSULE | Freq: Every day | ORAL | Status: DC

## 2011-03-19 MED ORDER — FISH OIL 1200 MG PO CAPS: 1200.0000 mg | ORAL_CAPSULE | Freq: Every day | ORAL | Status: DC

## 2011-03-19 MED ORDER — CALCIUM CARBONATE 600 MG PO TABS: 1200.0000 mg | ORAL_TABLET | Freq: Every day | ORAL | Status: DC

## 2011-03-19 MED ORDER — VENLAFAXINE HCL ER 37.5 MG PO CP24: 37.5000 mg | ORAL_CAPSULE | Freq: Every day | ORAL | Status: DC

## 2011-03-19 MED ORDER — GUAIFENESIN 200 MG PO TABS: 400.0000 mg | ORAL_TABLET | ORAL | Status: DC | PRN

## 2011-03-19 MED ORDER — ASCORBIC ACID 500 MG PO TABS: 1000.0000 mg | ORAL_TABLET | Freq: Every day | ORAL | Status: DC

## 2011-03-19 MED ORDER — VENLAFAXINE HCL ER 37.5 MG PO CP24: 37.5000 mg | ORAL_CAPSULE | Freq: Two times a day (BID) | ORAL | Status: DC

## 2011-03-19 MED ORDER — SENIOR MULTIVITAMIN PLUS PO TABS: 1.0000 | ORAL_TABLET | Freq: Every day | ORAL | Status: DC

## 2011-03-19 MED ORDER — LORATADINE 10 MG PO CAPS: 10.0000 mg | ORAL_CAPSULE | Freq: Every day | ORAL | Status: DC

## 2011-04-08 ENCOUNTER — Other Ambulatory Visit: Payer: Self-pay | Admitting: Internal Medicine

## 2011-04-08 MED ORDER — VENLAFAXINE HCL 25 MG PO TABS: 25.0000 mg | ORAL_TABLET | Freq: Every day | ORAL | Status: DC

## 2011-06-17 ENCOUNTER — Other Ambulatory Visit: Payer: Self-pay | Admitting: Internal Medicine

## 2011-06-17 MED ORDER — HYDROCOD POLST-CHLORPHEN POLST 10-8 MG/5ML PO LQCR: 5.0000 mL | Freq: Two times a day (BID) | ORAL | Status: DC | PRN

## 2011-06-17 MED ORDER — DOXYCYCLINE HYCLATE 100 MG PO CAPS: 100.0000 mg | ORAL_CAPSULE | Freq: Two times a day (BID) | ORAL | Status: AC

## 2011-07-08 ENCOUNTER — Ambulatory Visit (INDEPENDENT_AMBULATORY_CARE_PROVIDER_SITE_OTHER): Payer: 59 | Admitting: Internal Medicine

## 2011-07-08 VITALS — BP 158/74 | HR 70 | Temp 96.8°F | Resp 12 | Wt 168.7 lb

## 2011-07-08 DIAGNOSIS — B9689 Other specified bacterial agents as the cause of diseases classified elsewhere: Secondary | ICD-10-CM

## 2011-07-08 DIAGNOSIS — J019 Acute sinusitis, unspecified: Secondary | ICD-10-CM

## 2011-07-08 MED ORDER — LEVOFLOXACIN 500 MG PO TABS: 500.0000 mg | ORAL_TABLET | Freq: Every day | ORAL | Status: AC

## 2011-07-08 MED ORDER — HYDROCOD POLST-CHLORPHEN POLST 10-8 MG/5ML PO LQCR: 5.0000 mL | Freq: Two times a day (BID) | ORAL | Status: DC | PRN

## 2011-07-08 NOTE — Patient Instructions (Addendum)
Levaquin is an antibiotic to treat your upper respiratory tract infection. Use as directed. To stop taking any of the pills even if you begin to feel better after the first few days. Continue to use your loratadine and Flonase every day. If you develop any worsening cough, coughing up bloody sputum, fever or greater than 102, or other concerning symptoms come back to the clinic or go to the emergency room. Continue to take all of her other medications as directed.

## 2011-07-08 NOTE — Progress Notes (Signed)
Patient ID: Sharon Barrett, female   DOB: 10/01/1950, 61 y.o.   MRN: [id number] Subjective:     HPI:  Pt reports symptoms of productive cough, sore throat, and sinus congestion ~2-3 weeks ago treated with and unknown abx.  Her symptoms resolved but have now recurred.  She reports progressive worsening of cough productive of green sputum, increased nasal congestions, sore throat, and ear fullness.  She denies any fever, chills, ear drainage, nausea, vomiting, or diarrhea.   She is currently taking loratadine and flonase daily.   Review of Systems Constitutional: Negative for fever, chills, diaphoresis, activity change, appetite change, fatigue and unexpected weight change.  HENT: Negative for hearing loss, congestion and neck stiffness.   Eyes: Negative for photophobia, pain and visual disturbance.  Respiratory: Negative for cough, chest tightness, shortness of breath and wheezing.   Cardiovascular: Negative for chest pain and palpitations.  Gastrointestinal: Negative for abdominal pain, blood in stool and anal bleeding.  Genitourinary: Negative for dysuria, hematuria and difficulty urinating.  Musculoskeletal: Negative for joint swelling.  Neurological: Negative for dizziness, syncope, speech difficulty, weakness, numbness and headaches. ]     Objective:   Physical Exam VItal signs reviewed and stable. GEN: No apparent distress.  Alert and oriented x 3.  Pleasant, conversant, and cooperative to exam. HEENT: head is autraumatic and normocephalic.  Neck is supple without palpable masses or lymphadenopathy.  No JVD or carotid bruits.  Vision intact. Left ear canal and TM within normal limits. Right TM within normal limits, right canal mildly erythematous. No tenderness to traction with pinna or palpation of tragus. EOMI.  PERRLA.  Sclerae anicteric.  Conjunctivae without pallor or injection. Mucous membranes are moist.  Oropharynx is without erythema, exudates, or other abnormal lesions.  Dentition  is good RESP:  Lungs are clear to ascultation bilaterally with good air movement.  No wheezes, ronchi, or rubs. CARDIOVASCULAR: regular rate, normal rhythm.  Clear S1, S2, no murmurs, gallops, or rubs. ABDOMEN: soft, non-tender, non-distended.  Bowels sounds present in all quadrants and normoactive.  No palpable masses. EXT: warm and dry.  Peripheral pulses equal, intact, and +2 globally.  No clubbing or cyanosis.  Trace edema in bilateral lower extremities. SKIN: warm and dry with normal turgor.  No rashes or abnormal lesions observed. NEURO: CN II-XII grossly intact.  Muscle strength +5/5 in bilateral upper and lower extremities.  Sensation is grossly intact.  No focal deficit.     Assessment/Plan:

## 2011-07-08 NOTE — Assessment & Plan Note (Signed)
Pt has persistent productive cough with purulent nasal drainage and sinus congestion.  Her symptoms resolved for a brief period following empiric treatment with doxycycline and Tussionex for symptomatically with cough. Her symptoms have now recurred up and present for approximately 10 days. I believe there is some degree of allergic rhinitis contributing to her symptoms of sinus congestion, increased nasal drainage, and irritated throat however given the duration of her symptoms I feel it is possible she has developed an acute bacterial sinusitis and will treat empirically with a 10 day course of Levaquin. She is advised to continue using her loratadine and Flonase daily. She denies to return to the clinic should she develop any fever, shaking chills, hemoptysis, worsening cough, shortness of breath, or other concerning symptoms.

## 2011-08-27 ENCOUNTER — Ambulatory Visit (INDEPENDENT_AMBULATORY_CARE_PROVIDER_SITE_OTHER): Payer: 59 | Admitting: Internal Medicine

## 2011-08-27 DIAGNOSIS — S93409A Sprain of unspecified ligament of unspecified ankle, initial encounter: Secondary | ICD-10-CM

## 2011-08-27 DIAGNOSIS — X58XXXA Exposure to other specified factors, initial encounter: Secondary | ICD-10-CM

## 2011-08-27 MED ORDER — MELOXICAM 7.5 MG PO TABS: 7.5000 mg | ORAL_TABLET | Freq: Every day | ORAL | Status: DC

## 2011-08-27 MED ORDER — VENLAFAXINE HCL 25 MG PO TABS: 25.0000 mg | ORAL_TABLET | Freq: Every day | ORAL | Status: DC

## 2011-08-27 NOTE — Patient Instructions (Signed)
Ankle Sprain An ankle sprain is an injury to the strong, fibrous tissues (ligaments) that hold the bones of your ankle joint together.  CAUSES Ankle sprain usually is caused by a fall or by twisting your ankle. People who participate in sports are more prone to these types of injuries.  SYMPTOMS  Symptoms of ankle sprain include:  Pain in your ankle. The pain may be present at rest or only when you are trying to stand or walk.   Swelling.   Bruising. Bruising may develop immediately or within 1 to 2 days after your injury.   Difficulty standing or walking.  DIAGNOSIS  Your caregiver will ask you details about your injury and perform a physical exam of your ankle to determine if you have an ankle sprain. During the physical exam, your caregiver will press and squeeze specific areas of your foot and ankle. Your caregiver will try to move your ankle in certain ways. An X-ray exam may be done to be sure a bone was not broken or a ligament did not separate from one of the bones in your ankle (avulsion).  TREATMENT  Certain types of braces can help stabilize your ankle. Your caregiver can make a recommendation for this. Your caregiver may recommend the use of medication for pain. If your sprain is severe, your caregiver may refer you to a surgeon who helps to restore function to parts of your skeletal system (orthopedist) or a physical therapist. HOME CARE INSTRUCTIONS  Apply ice to your injury for 1 to 2 days or as directed by your caregiver. Applying ice helps to reduce inflammation and pain.  Put ice in a plastic bag.   Place a towel between your skin and the bag.   Leave the ice on for 15 to 20 minutes at a time, every 2 hours while you are awake.   Take over-the-counter or prescription medicines for pain, discomfort, or fever only as directed by your caregiver.   Keep your injured leg elevated, when possible, to lessen swelling.   If your caregiver recommends crutches, use them as  instructed. Gradually, put weight on the affected ankle. Continue to use crutches or a cane until you can walk without feeling pain in your ankle.   If you have a plaster splint, wear the splint as directed by your caregiver. Do not rest it on anything harder than a pillow the first 24 hours. Do not put weight on it. Do not get it wet. You may take it off to take a shower or bath.   You may have been given an elastic bandage to wear around your ankle to provide support. If the elastic bandage is too tight (you have numbness or tingling in your foot or your foot becomes cold and blue), adjust the bandage to make it comfortable.   If you have an air splint, you may blow more air into it or let air out to make it more comfortable. You may take your splint off at night and before taking a shower or bath.   Wiggle your toes in the splint several times per day if you are able.  SEEK MEDICAL CARE IF:   You have an increase in bruising, swelling, or pain.   Your toes feel cold.   Pain relief is not achieved with medication.  SEEK IMMEDIATE MEDICAL CARE IF: Your toes are numb or blue or you have severe pain. MAKE SURE YOU:   Understand these instructions.   Will watch your condition.     Will get help right away if you are not doing well or get worse.  Document Released: 02/22/2005 Document Revised: 02/11/2011 Document Reviewed: 09/27/2007 [employer] Patient Information 2012 [employer]. 

## 2011-08-27 NOTE — Progress Notes (Signed)
  Subjective:    Patient ID: Sharon Barrett, female    DOB: 10-26-1950, 61 y.o.   MRN: [id number]  HPI  Sharon Barrett is a 61 year old female with past medical history most significant for hyperlipidemia, hypothyroidism, depression and insomnia.   Patient is an employee of [employer].  She is here today as she twisted her ankle yesterday.  Pain 6-7/10, sharp, radiating to the back of my leg, worse with walking, relieved with rest and aleve. There is some swelling noted on the anterior aspect of the ankle. No fever, chills or nausea, vomiting noted.   Review of Systems  All other systems reviewed and are negative.       Objective:   Physical Exam  Constitutional: She is oriented to person, place, and time. She appears well-developed and well-nourished.  HENT:  Head: Normocephalic and atraumatic.  Eyes: Conjunctivae and EOM are normal. Pupils are equal, round, and reactive to light. No scleral icterus.  Neck: Normal range of motion. Neck supple. No JVD present. No thyromegaly present.  Cardiovascular: Normal rate, regular rhythm, normal heart sounds and intact distal pulses.  Exam reveals no gallop and no friction rub.   No murmur heard. Pulmonary/Chest: Effort normal and breath sounds normal. No respiratory distress. She has no wheezes. She has no rales.  Abdominal: Soft. Bowel sounds are normal. She exhibits no distension and no mass. There is no tenderness. There is no rebound and no guarding.  Musculoskeletal: Normal range of motion. She exhibits edema and tenderness.       Patient has a localized area of swelling along the anterolateral aspect of her right ankle. Range of motion was intact but painful. Patient had no tenderness along the joint line. No redness noted.  Lymphadenopathy:    She has no cervical adenopathy.  Neurological: She is alert and oriented to person, place, and time.  Psychiatric: She has a normal mood and affect. Her behavior is normal.            Assessment & Plan:

## 2011-08-27 NOTE — Assessment & Plan Note (Signed)
According to patient's history and physical exam findings I believe that patient has is an ankle sprain grade 1. I will prescribe NSAIDs at this time along with conservative measures like ice over the swelling. The patient does not need an imaging study at this time as it would not change the diagnoses. Followup as needed. Patient was given information about Ace wrap bandage. He will buy it over-the-counter.

## 2011-11-25 ENCOUNTER — Other Ambulatory Visit: Payer: Self-pay | Admitting: Internal Medicine

## 2011-11-25 NOTE — Telephone Encounter (Signed)
6 month supply given in Jan. Just now asking for refill. Used mostly for sleep. Mostly seen for acute issues. Will refill and update PMHx, and PL.

## 2011-11-25 NOTE — Telephone Encounter (Signed)
Rx called in to pharmacy. 

## 2012-01-24 ENCOUNTER — Other Ambulatory Visit: Payer: Self-pay | Admitting: Internal Medicine

## 2012-03-23 ENCOUNTER — Other Ambulatory Visit: Payer: Self-pay | Admitting: Internal Medicine

## 2012-03-31 ENCOUNTER — Other Ambulatory Visit: Payer: Self-pay | Admitting: *Deleted

## 2012-04-03 MED ORDER — FLUTICASONE PROPIONATE 50 MCG/ACT NA SUSP: 1.0000 | Freq: Two times a day (BID) | NASAL | Status: DC | PRN

## 2012-04-22 ENCOUNTER — Other Ambulatory Visit: Payer: Self-pay

## 2012-07-19 ENCOUNTER — Ambulatory Visit (INDEPENDENT_AMBULATORY_CARE_PROVIDER_SITE_OTHER): Payer: 59 | Admitting: Internal Medicine

## 2012-07-19 VITALS — BP 144/69 | HR 57 | Temp 97.2°F | Wt 160.3 lb

## 2012-07-19 DIAGNOSIS — M5430 Sciatica, unspecified side: Secondary | ICD-10-CM

## 2012-07-19 DIAGNOSIS — M5432 Sciatica, left side: Secondary | ICD-10-CM | POA: Insufficient documentation

## 2012-07-19 MED ORDER — TRAMADOL HCL 50 MG PO TABS: 50.0000 mg | ORAL_TABLET | Freq: Four times a day (QID) | ORAL | Status: DC | PRN

## 2012-07-19 NOTE — Patient Instructions (Addendum)
Take over the counter aleve and tylenol scheduled for your pain. Take the tramadol as needed for your pain. Let me know before your trip if no better.

## 2012-07-19 NOTE — Assessment & Plan Note (Signed)
About three months of L leg pain. No trauma that started it. Starts L upper buttock, down post thigh around to anterior knee and down ant/lateral leg. To toe but toe not that painful. Increased past 2 weeks. Nothing makes it better or worse - standing, sitting, etc. Does get a bitt better with Advil / Aleve / Tylenol but using PRN. Now waking her up at night. No groin pain, no weakness, no bowel / bladder involvement, great toe not catching, no fever, no wt loss, no night sweats.  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 Also had spondylitises that failed conservative tx and was successfully tx with surgery. This pain different than those pains to the best of her memory.   Going to [place] in 1/5 weeks for 2 week trip.   Flexeril makes her woozy. Benzos make her sleep. No experience with other pain meds.  This seems to be sciatica on L. Hip pathology unlikely as the pain starts in buttocks and radiates and no groin pain. No red flags (except duration) to indicate imaging need. And imaging unlikely to change what I do unless there is unexpected finding. Therefore will treat pain. Sch Aleve and tylenol. Tramadol PRN - and to try before trip to [place]. Once back, imaging, PT referral are options.

## 2012-07-19 NOTE — Progress Notes (Signed)
  Subjective:    Patient ID: Sharon Barrett, female    DOB: 09-29-1950, 62 y.o.   MRN: [id number]  HPI  Please see the A&P for the status of the pt's chronic medical problems.  Review of Systems  Constitutional: Negative for fever, diaphoresis and unexpected weight change.  Cardiovascular: Positive for palpitations.  Gastrointestinal:       No bowel bladder sxs inc incontinence   Musculoskeletal: Positive for back pain. Negative for myalgias, joint swelling, arthralgias and gait problem.  Neurological: Negative for weakness.  Psychiatric/Behavioral: Positive for sleep disturbance.       Objective:   Physical Exam  Constitutional: She is oriented to person, place, and time. She appears well-developed and well-nourished.  HENT:  Head: Normocephalic and atraumatic.  Right Ear: External ear normal.  Left Ear: External ear normal.  Nose: Nose normal.  Eyes: Conjunctivae and EOM are normal.  Neck: Normal range of motion.  Cardiovascular: Regular rhythm.   Musculoskeletal: Normal range of motion. She exhibits no edema and no tenderness.  Neurological: She is alert and oriented to person, place, and time.  Strength LE 5/5 B. No decreased int / ext rotation with R leg although pain with crossing L leg so that L ankle on R knee. ROM back - extension / flexion / twist nl and without pain. Pain point tenderness L buttock. Neg SLR on L. No knee crepitus. Gait nl.  Skin: Skin is warm and dry. She is not diaphoretic.  Psychiatric: She has a normal mood and affect. Her behavior is normal. Judgment and thought content normal.          Assessment & Plan:

## 2012-08-24 ENCOUNTER — Other Ambulatory Visit: Payer: Self-pay | Admitting: Internal Medicine

## 2012-08-24 NOTE — Telephone Encounter (Signed)
Clonazepam rx called to [facility]. 

## 2013-01-11 ENCOUNTER — Other Ambulatory Visit: Payer: Self-pay

## 2013-01-23 ENCOUNTER — Ambulatory Visit (INDEPENDENT_AMBULATORY_CARE_PROVIDER_SITE_OTHER): Payer: 59 | Admitting: Internal Medicine

## 2013-01-23 ENCOUNTER — Encounter: Payer: Self-pay | Admitting: Internal Medicine

## 2013-01-23 VITALS — BP 122/65 | HR 60 | Temp 97.0°F | Ht 64.0 in | Wt 169.3 lb

## 2013-01-23 DIAGNOSIS — M549 Dorsalgia, unspecified: Secondary | ICD-10-CM | POA: Insufficient documentation

## 2013-01-23 MED ORDER — CYCLOBENZAPRINE HCL 10 MG PO TABS: 5.0000 mg | ORAL_TABLET | Freq: Every day | ORAL | Status: DC

## 2013-01-23 NOTE — Assessment & Plan Note (Signed)
Taken together, the history of prior injury and physical exam point to a musculoskeletal origin of patient's back pain. She has no red flag signs or symptoms that would be worrisome for cauda equina syndrome or other more severe etiology for her back pain. I am not worried for fracture at this point given she has no point tenderness over her spinous processes or vertebrae. Suspect that patient strained her paraspinal muscles when her plane landed. I think she would benefit from a muscle relaxer, so I prescribed Flexeril 5-10mg  to be taken at night before bed. I encouraged patient to continue using naprosyn and tylenol as well as the ice pack if this provides comfort. I also suggested that she use heat on her back if this helps. I gave patient a list of back exercises to complete at home a few times per day. If patient returns to clinic within the next 2 weeks with no improvement of worsening of her pain, I believe she may benefit from physical therapy (assuming she has no red flag signs/symptoms in which case imaging would be necessary).

## 2013-01-23 NOTE — Patient Instructions (Addendum)
Thank you for your visit.   I prescribed flexeril 5-10mg  as tolerated to be taken every night before bed. I also included back exercises to be done at least 2 times per day (below).   Please return to clinic as needed if your pain worsens or does not improve within the next 1-2 weeks.   Back Exercises Back exercises help treat and prevent back injuries. The goal of back exercises is to increase the strength of your abdominal and back muscles and the flexibility of your back. These exercises should be started when you no longer have back pain. Back exercises include:  Pelvic Tilt. Lie on your back with your knees bent. Tilt your pelvis until the lower part of your back is against the floor. Hold this position 5 to 10 sec and repeat 5 to 10 times.  Knee to Chest. Pull first 1 knee up against your chest and hold for 20 to 30 seconds, repeat this with the other knee, and then both knees. This may be done with the other leg straight or bent, whichever feels better.  Sit-Ups or Curl-Ups. Bend your knees 90 degrees. Start with tilting your pelvis, and do a partial, slow sit-up, lifting your trunk only 30 to 45 degrees off the floor. Take at least 2 to 3 seconds for each sit-up. Do not do sit-ups with your knees out straight. If partial sit-ups are difficult, simply do the above but with only tightening your abdominal muscles and holding it as directed.  Hip-Lift. Lie on your back with your knees flexed 90 degrees. Push down with your feet and shoulders as you raise your hips a couple inches off the floor; hold for 10 seconds, repeat 5 to 10 times.  Back arches. Lie on your stomach, propping yourself up on bent elbows. Slowly press on your hands, causing an arch in your low back. Repeat 3 to 5 times. Any initial stiffness and discomfort should lessen with repetition over time.  Shoulder-Lifts. Lie face down with arms beside your body. Keep hips and torso pressed to floor as you slowly lift your head and  shoulders off the floor. Do not overdo your exercises, especially in the beginning. Exercises may cause you some mild back discomfort which lasts for a few minutes; however, if the pain is more severe, or lasts for more than 15 minutes, do not continue exercises until you see your caregiver. Improvement with exercise therapy for back problems is slow.  See your caregivers for assistance with developing a proper back exercise program. Document Released: 04/01/2004 Document Revised: 05/17/2011 Document Reviewed: 12/24/2010 [facility] Patient Information 2014 [place], [place].
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
## 2013-01-23 NOTE — Progress Notes (Signed)
I saw and evaluated the patient.  I personally confirmed the key portions of the history and exam documented by Dr. Chikowski and I reviewed pertinent patient test results.  The assessment, diagnosis, and plan were formulated together and I agree with the documentation in the resident's note. 

## 2013-03-12 ENCOUNTER — Other Ambulatory Visit: Payer: Self-pay | Admitting: Internal Medicine

## 2013-03-12 NOTE — Telephone Encounter (Signed)
Rx called in 

## 2013-04-06 ENCOUNTER — Encounter: Payer: Self-pay | Admitting: Internal Medicine

## 2013-04-06 ENCOUNTER — Ambulatory Visit (INDEPENDENT_AMBULATORY_CARE_PROVIDER_SITE_OTHER): Payer: 59 | Admitting: Internal Medicine

## 2013-04-06 VITALS — BP 149/71 | HR 55 | Temp 96.8°F | Ht 64.0 in | Wt 170.3 lb

## 2013-04-06 DIAGNOSIS — J019 Acute sinusitis, unspecified: Secondary | ICD-10-CM

## 2013-04-06 DIAGNOSIS — B9689 Other specified bacterial agents as the cause of diseases classified elsewhere: Secondary | ICD-10-CM

## 2013-04-06 MED ORDER — DOXYCYCLINE HYCLATE 100 MG PO TABS: 100.0000 mg | ORAL_TABLET | Freq: Two times a day (BID) | ORAL | Status: DC

## 2013-04-06 NOTE — Patient Instructions (Signed)
1. Please take doxycycline 1 tablet (100mg ) two times per day for 7 days for treatment of your sinusitis.     2. Please take all medications as prescribed.    3. If you have worsening of your symptoms or new symptoms arise, please call the clinic ([phone]), or go to the ER immediately if symptoms are severe.  Sinusitis Sinusitis is redness, soreness, and swelling (inflammation) of the paranasal sinuses. Paranasal sinuses are air pockets within the bones of your face (beneath the eyes, the middle of the forehead, or above the eyes). In healthy paranasal sinuses, mucus is able to drain out, and air is able to circulate through them by way of your nose. However, when your paranasal sinuses are inflamed, mucus and air can become trapped. This can allow bacteria and other germs to grow and cause infection. Sinusitis can develop quickly and last only a short time (acute) or continue over a long period (chronic). Sinusitis that lasts for more than 12 weeks is considered chronic.  CAUSES  Causes of sinusitis include:  Allergies.  Structural abnormalities, such as displacement of the cartilage that separates your nostrils (deviated septum), which can decrease the air flow through your nose and sinuses and affect sinus drainage.  Functional abnormalities, such as when the small hairs (cilia) that line your sinuses and help remove mucus do not work properly or are not present. SYMPTOMS  Symptoms of acute and chronic sinusitis are the same. The primary symptoms are pain and pressure around the affected sinuses. Other symptoms include:  Upper toothache.  Earache.  Headache.  Bad breath.  Decreased sense of smell and taste.  A cough, which worsens when you are lying flat.  Fatigue.  Fever.  Thick drainage from your nose, which often is green and may contain pus (purulent).  Swelling and warmth over the affected sinuses. DIAGNOSIS  Your caregiver will perform a physical exam. During the  exam, your caregiver may:  Look in your nose for signs of abnormal growths in your nostrils (nasal polyps).  Tap over the affected sinus to check for signs of infection.  View the inside of your sinuses (endoscopy) with a special imaging device with a light attached (endoscope), which is inserted into your sinuses. If your caregiver suspects that you have chronic sinusitis, one or more of the following tests may be recommended:  Allergy tests.  Nasal culture A sample of mucus is taken from your nose and sent to a lab and screened for bacteria.  Nasal cytology A sample of mucus is taken from your nose and examined by your caregiver to determine if your sinusitis is related to an allergy. TREATMENT  Most cases of acute sinusitis are related to a viral infection and will resolve on their own within 10 days. Sometimes medicines are prescribed to help relieve symptoms (pain medicine, decongestants, nasal steroid sprays, or saline sprays).  However, for sinusitis related to a bacterial infection, your caregiver will prescribe antibiotic medicines. These are medicines that will help kill the bacteria causing the infection.  Rarely, sinusitis is caused by a fungal infection. In theses cases, your caregiver will prescribe antifungal medicine. For some cases of chronic sinusitis, surgery is needed. Generally, these are cases in which sinusitis recurs more than 3 times per year, despite other treatments. HOME CARE INSTRUCTIONS   Drink plenty of water. Water helps thin the mucus so your sinuses can drain more easily.  Use a humidifier.  Inhale steam 3 to 4 times a day (for example,  sit in the bathroom with the shower running).  Apply a warm, moist washcloth to your face 3 to 4 times a day, or as directed by your caregiver.  Use saline nasal sprays to help moisten and clean your sinuses.  Take over-the-counter or prescription medicines for pain, discomfort, or fever only as directed by your  caregiver. SEEK IMMEDIATE MEDICAL CARE IF:  You have increasing pain or severe headaches.  You have nausea, vomiting, or drowsiness.  You have swelling around your face.  You have vision problems.  You have a stiff neck.  You have difficulty breathing. MAKE SURE YOU:   Understand these instructions.  Will watch your condition.  Will get help right away if you are not doing well or get worse. Document Released: 02/22/2005 Document Revised: 05/17/2011 Document Reviewed: 03/09/2011 [facility] Patient Information 2014 [place], [place].

## 2013-04-06 NOTE — Assessment & Plan Note (Addendum)
Assessment:  Headache, sinus pressure, rhinorrhea, productive cough (green mucous) for 5 days, however, symptoms previously present for about 3 weeks resolved for a week and returned so likely bacterial sinusitis.  She is afebrile, no neck stiffness, no photophobia, no altered mentation and does not appear extremely ill so doubt meningitis.  Cervical lymphadenopathy but Centor score 0 making strep throat very unlikely.  Plan:  1) Doxycycline 100mg  BID x 7 days (doxy is a first-line alternative in PCN allergic)            2) Return to clinic if no improvement in 3-5 days.            3) Return to clinic for scheduled routine health maintenance with PCP on Feb 10.

## 2013-04-06 NOTE — Progress Notes (Signed)
   Subjective:    Patient ID: Sharon Barrett, female    DOB: 03/09/1950, 63 y.o.   MRN: [id number]  HPI Comments: Sharon Barrett is a 63 year old with PMH of allergic rhinitis, sciatica and depression presenting with rhinorrhea, scratchy throat, head/sinus pressure, nasal congestion, productive cough (greenish mucous) and postnasal drip x 5 days.  She had similar URI symtpoms that started 3-4 weeks ago that was treated symptomatically and resolved after a few weeks. Now symptoms have returned.  She  Dyspnea, wheezing, fevers/chills or myalgias and has no history of asthma, COPD or smoking.  Taking loratadine, Afrin and Benadrly with little relief.  Feels it is worsening.      Review of Systems  Constitutional: Negative for fever, chills and appetite change.  HENT: Positive for congestion, postnasal drip, rhinorrhea, sinus pressure and sore throat. Negative for ear discharge and ear pain.   Eyes: Negative for photophobia.  Respiratory: Negative for chest tightness and shortness of breath.   Cardiovascular: Negative for chest pain.  Gastrointestinal: Negative for diarrhea and constipation.  Musculoskeletal: Positive for neck pain. Negative for myalgias and neck stiffness.  Neurological: Positive for headaches.       Objective:   Physical Exam  Vitals reviewed. Constitutional: She is oriented to person, place, and time. She appears well-developed. No distress.  HENT:  Right Ear: External ear normal.  Left Ear: External ear normal.  Mouth/Throat: Oropharynx is clear and moist. No oropharyngeal exudate.  Maxillary sinuses TTP; tympanic membranes intact  Eyes: Conjunctivae are normal. Pupils are equal, round, and reactive to light.  Neck: Normal range of motion. Neck supple.  + submandibular lymphadenopathy  Cardiovascular: Normal rate, regular rhythm and normal heart sounds.   Pulmonary/Chest: Effort normal and breath sounds normal. No respiratory distress. She has no wheezes. She has no  rales.  Abdominal: Soft. Bowel sounds are normal. She exhibits no distension. There is no tenderness.  Lymphadenopathy:    She has cervical adenopathy.  Neurological: She is alert and oriented to person, place, and time.  Skin: Skin is warm. She is not diaphoretic.  Psychiatric: She has a normal mood and affect. Her behavior is normal.          Assessment & Plan:  Please see problem based assessment and plan.

## 2013-04-09 NOTE — Progress Notes (Signed)
Case discussed with Dr. Wilson at the time of the visit.  We reviewed the resident's history and exam and pertinent patient test results.  I agree with the assessment, diagnosis, and plan of care documented in the resident's note. 

## 2013-04-17 ENCOUNTER — Ambulatory Visit (INDEPENDENT_AMBULATORY_CARE_PROVIDER_SITE_OTHER): Payer: 59 | Admitting: Internal Medicine

## 2013-04-17 ENCOUNTER — Encounter: Payer: Self-pay | Admitting: Internal Medicine

## 2013-04-17 VITALS — BP 151/67 | HR 60 | Temp 97.0°F | Ht 64.0 in | Wt 167.8 lb

## 2013-04-17 DIAGNOSIS — J309 Allergic rhinitis, unspecified: Secondary | ICD-10-CM

## 2013-04-17 DIAGNOSIS — M549 Dorsalgia, unspecified: Secondary | ICD-10-CM

## 2013-04-17 DIAGNOSIS — D649 Anemia, unspecified: Secondary | ICD-10-CM

## 2013-04-17 DIAGNOSIS — Z1239 Encounter for other screening for malignant neoplasm of breast: Secondary | ICD-10-CM

## 2013-04-17 LAB — CBC WITH DIFFERENTIAL/PLATELET
Basophils Absolute: 0 10*3/uL (ref 0.0–0.1)
Basophils Relative: 1 % (ref 0–1)
EOS PCT: 2 % (ref 0–5)
Eosinophils Absolute: 0.1 10*3/uL (ref 0.0–0.7)
HEMATOCRIT: 35.2 % — AB (ref 36.0–46.0)
Hemoglobin: 11.5 g/dL — ABNORMAL LOW (ref 12.0–15.0)
LYMPHS ABS: 1.6 10*3/uL (ref 0.7–4.0)
Lymphocytes Relative: 35 % (ref 12–46)
MCH: 28.6 pg (ref 26.0–34.0)
MCHC: 32.7 g/dL (ref 30.0–36.0)
MCV: 87.6 fL (ref 78.0–100.0)
MONO ABS: 0.4 10*3/uL (ref 0.1–1.0)
MONOS PCT: 9 % (ref 3–12)
Neutro Abs: 2.5 10*3/uL (ref 1.7–7.7)
Neutrophils Relative %: 53 % (ref 43–77)
PLATELETS: 288 10*3/uL (ref 150–400)
RBC: 4.02 MIL/uL (ref 3.87–5.11)
RDW: 14.8 % (ref 11.5–15.5)
WBC: 4.6 10*3/uL (ref 4.0–10.5)

## 2013-04-17 LAB — BASIC METABOLIC PANEL WITH GFR
BUN: 13 mg/dL (ref 6–23)
CHLORIDE: 103 meq/L (ref 96–112)
CO2: 30 meq/L (ref 19–32)
CREATININE: 0.65 mg/dL (ref 0.50–1.10)
Calcium: 10.5 mg/dL (ref 8.4–10.5)
GFR, Est African American: >89 mL/min
GFR, Est Non African American: >89 mL/min
Glucose, Bld: 114 mg/dL — ABNORMAL HIGH (ref 70–99)
Potassium: 5 mEq/L (ref 3.5–5.3)
SODIUM: 139 meq/L (ref 135–145)

## 2013-04-17 LAB — LIPID PANEL
CHOLESTEROL: 211 mg/dL — AB (ref 0–200)
HDL: 43 mg/dL (ref >39)
LDL Cholesterol: 118 mg/dL — ABNORMAL HIGH (ref 0–99)
TRIGLYCERIDES: 251 mg/dL — AB (ref <150)
Total CHOL/HDL Ratio: 4.9 Ratio
VLDL: 50 mg/dL — AB (ref 0–40)

## 2013-04-17 LAB — FERRITIN: Ferritin: 5 ng/mL — ABNORMAL LOW (ref 10–291)

## 2013-04-17 NOTE — Progress Notes (Signed)
   Subjective:    Patient ID: Sharon Barrett, female    DOB: November 19, 1950, 63 y.o.   MRN: [id number]  Headache  Associated symptoms include eye pain, rhinorrhea and sinus pressure. Pertinent negatives include no abdominal pain.    Please see the A&P for the status of the pt's chronic medical problems.  Review of Systems  Constitutional: Negative for activity change, appetite change and unexpected weight change.  HENT: Positive for postnasal drip, rhinorrhea and sinus pressure.   Eyes: Positive for pain.  Respiratory: Negative for shortness of breath.   Cardiovascular: Negative for chest pain and leg swelling.  Gastrointestinal: Negative for abdominal pain.  Neurological: Positive for headaches.  Psychiatric/Behavioral: Negative for sleep disturbance.       Objective:   Physical Exam  Constitutional: She is oriented to person, place, and time. She appears well-developed and well-nourished. No distress.  HENT:  Head: Normocephalic and atraumatic.  Right Ear: External ear normal.  Left Ear: External ear normal.  Nose: Nose normal.  Eyes: Conjunctivae and EOM are normal. Right eye exhibits no discharge. Left eye exhibits no discharge.  Sounds congested.  Neck: Neck supple.  Cardiovascular: Normal rate, regular rhythm and normal heart sounds.   Pulmonary/Chest: Effort normal and breath sounds normal.  Musculoskeletal: She exhibits no edema and no tenderness.  Lymphadenopathy:    She has no cervical adenopathy.  Neurological: She is alert and oriented to person, place, and time.  Skin: Skin is warm and dry. She is not diaphoretic.  Psychiatric: She has a normal mood and affect. Her behavior is normal. Judgment and thought content normal.          Assessment & Plan:

## 2013-04-17 NOTE — Patient Instructions (Signed)
1. See me in one year 2. I ordered your mammogram 3. Call me if you need anything

## 2013-04-18 ENCOUNTER — Other Ambulatory Visit: Payer: Self-pay | Admitting: Internal Medicine

## 2013-04-18 MED ORDER — FERROUS SULFATE 325 (65 FE) MG PO TABS: 325.0000 mg | ORAL_TABLET | Freq: Every day | ORAL | Status: DC

## 2013-04-18 NOTE — Assessment & Plan Note (Signed)
Recent URI / sinusitis that was tx with ABX. Sxs resolved except still with HA, sinus pressure, PND, rhinorrhea. This happens Q yr and is expected and no additional tx needed. Discussed the QHS use of Afrin and has not noticed rebound rhinorrhea.

## 2013-04-18 NOTE — Assessment & Plan Note (Signed)
[employer] did not let her donate blood. Will check CBC and ferritin.

## 2013-04-18 NOTE — Assessment & Plan Note (Signed)
occasional pain when stands or moves. Uses meds only PRN. If worse, will call and consider PT.

## 2013-05-24 ENCOUNTER — Other Ambulatory Visit: Payer: Self-pay | Admitting: Internal Medicine

## 2013-08-14 ENCOUNTER — Other Ambulatory Visit: Payer: Self-pay | Admitting: Internal Medicine

## 2013-08-20 ENCOUNTER — Ambulatory Visit (INDEPENDENT_AMBULATORY_CARE_PROVIDER_SITE_OTHER): Payer: 59 | Admitting: Internal Medicine

## 2013-08-20 ENCOUNTER — Encounter: Payer: Self-pay | Admitting: Internal Medicine

## 2013-08-20 VITALS — BP 139/70 | HR 62 | Temp 98.6°F | Ht 64.0 in | Wt 169.3 lb

## 2013-08-20 DIAGNOSIS — B9689 Other specified bacterial agents as the cause of diseases classified elsewhere: Secondary | ICD-10-CM

## 2013-08-20 DIAGNOSIS — J019 Acute sinusitis, unspecified: Secondary | ICD-10-CM

## 2013-08-20 MED ORDER — HYDROCOD POLST-CHLORPHEN POLST 10-8 MG/5ML PO LQCR: 5.0000 mL | Freq: Every evening | ORAL | Status: DC | PRN

## 2013-08-20 MED ORDER — DOXYCYCLINE HYCLATE 100 MG PO CAPS: 100.0000 mg | ORAL_CAPSULE | Freq: Two times a day (BID) | ORAL | Status: DC

## 2013-08-20 NOTE — Assessment & Plan Note (Signed)
Symptoms and clinical signs consistent with acute rhino sinusitis. Given severity of the symptoms, lack of relief from conservation management such as nasal decongestants and anti-histamines, intra-nasal steroids, purulent nature of the sputum, suspect bacterial etiology. Discussed with the attending regarding further management.  Plans: Empirically treat for acute bacterial rhino sinusitis with Doxycycline 100 bid x 7 days (patient responded to Doxy in the past). Continue conservative management. Recommended to use OTC NS nasal spray. Tussionex qhs and prn for cough. Follow up as needed or if symptoms persist or worsen.

## 2013-08-20 NOTE — Patient Instructions (Signed)
Use the antibiotic and cough syrup as instructed.

## 2013-08-20 NOTE — Progress Notes (Signed)
Subjective:   Patient ID: Sharon Barrett female   DOB: 1950-09-24 63 y.o.   MRN: [id number]  HPI: [name] is a 63 y.o. woman with no significant PMH except for depression comes to the office with CC of cough, sinus headache x 5 days.  Patient reports that her symptoms started about 5 days ago with "sinus headaches" and productive cough with greenish colored sputum. She reports frontal, maxillary sinus headaches. She also complains of "scratchy throat", runny nose. She denies any ear pain/discharge, fever/chills/body pains. She denies any SOB, chest pain. She reports that her husband was sick last week and was coughing a lot and she thinks that she may have gotten it from him. She reports using the flonase, loratidine, afrin without any relief.  She denies any other complaints.  No past medical history on file. Current Outpatient Prescriptions  Medication Sig Dispense Refill  . acetaminophen (TYLENOL) 500 MG tablet Take 500 mg by mouth every 6 (six) hours as needed.      . ASPIR-LOW 81 MG EC tablet TAKE 1 TABLET BY MOUTH DAILY  30 tablet  11  . calcium carbonate (CALCIUM 600) 600 MG TABS Take 2 tablets (1,200 mg total) by mouth daily.  180 tablet  3  . chlorpheniramine-HYDROcodone (TUSSIONEX PENNKINETIC ER) 10-8 MG/5ML LQCR Take 5 mLs by mouth at bedtime as needed for cough.  1 Bottle  0  . Cholecalciferol (VITAMIN D3) 2000 UNITS capsule Take 1 capsule (2,000 Units total) by mouth daily.  90 capsule  3  . clonazePAM (KLONOPIN) 0.5 MG tablet TAKE 1 TABLET BY MOUTH TWICE DAILY AS NEEDED FOR ANXIETY  60 tablet  5  . docusate sodium (COLACE) 100 MG capsule Take 2 capsules (200 mg total) by mouth daily.  180 capsule  3  . doxycycline (VIBRAMYCIN) 100 MG capsule Take 1 capsule (100 mg total) by mouth 2 (two) times daily.  14 capsule  0  . ferrous sulfate 325 (65 FE) MG tablet Take 1 tablet (325 mg total) by mouth daily. Increase to TID with meals as tolerated.  90 tablet  3  . fluticasone  (FLONASE) 50 MCG/ACT nasal spray USE 1 SPRAY IN EACH NOSTRIL TWICE DAILY AS NEEDED  16 g  11  . ibuprofen (ADVIL,MOTRIN) 200 MG tablet Take 200 mg by mouth every 6 (six) hours as needed.      . Loratadine 10 MG CAPS Take 1 capsule (10 mg total) by mouth daily.  90 each  3  . Multiple Vitamins-Minerals (SENIOR MULTIVITAMIN PLUS) TABS Take 1 tablet by mouth daily.  90 tablet  3  . naproxen sodium (ANAPROX) 220 MG tablet Take 220 mg by mouth 2 (two) times daily as needed.      . Omega-3 Fatty Acids (FISH OIL) 1200 MG CAPS Take 1 capsule (1,200 mg total) by mouth daily.  90 capsule  3  . oxymetazoline (AFRIN) 0.05 % nasal spray Place 2 sprays into the nose 2 (two) times daily.      . phenylephrine (SUDAFED PE) 10 MG TABS Take 1 tablet (10 mg total) by mouth every 4 (four) hours as needed.  84 tablet  11  . venlafaxine (EFFEXOR) 25 MG tablet TAKE 1 TABLET BY MOUTH DAILY.  90 tablet  3   No current facility-administered medications for this visit.   Family History  Problem Relation Age of Onset  . Heart attack Father     died at 62  . Alzheimer's disease Mother  died at 63  . Hypertension Mother   . Sarcoidosis Sister     both older sisters  . Lung cancer      two aunts   History   Social History  . Marital Status: Married    Spouse Name: N/A    Number of Children: N/A  . Years of Education: N/A   Social History Main Topics  . Smoking status: Never Smoker   . Smokeless tobacco: None  . Alcohol Use: Yes     Comment: occasionally  . Drug Use: No  . Sexual Activity: None   Other Topics Concern  . 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    [employer]   Lives in [place] with her husband    Three children   Review of Systems: Pertinent items are noted in HPI. Objective:  Physical Exam: Filed Vitals:   08/20/13 1126  BP: 139/70  Pulse: 62  Temp: 98.6 F (37 C)  TempSrc: Oral   Height: 5\' 4"  (1.626 m)  Weight: 169 lb 4.8 oz (76.794 kg)  SpO2: 98%   Constitutional: Vital signs reviewed.  Patient is a well-developed and well-nourished and is in no acute distress and cooperative with exam. Alert and oriented x3.  Ear: TM normal bilaterally Nose: Mildly enalarged and erythematous inferior turbinates noted.  Mouth: Moist mucous membranes noted. Mild posterior pharyngeal erythema noted. No exudates or discharge noted.  Sinuses: Mild TTP over the frontal and maxillary sinuses (R>L) Neck: Supple, No palpable LN's. Cardiovascular: RRR, S1 normal, S2 normal, no MRG. Pulmonary/Chest: normal respiratory effort, CTAB, no wheezes, rales, or rhonchi Neurological: A&O x3  Assessment & Plan:

## 2013-08-20 NOTE — Progress Notes (Signed)
I saw patient and discussed with Dr. Eyvonne Mechanic at the time of the visit.  We reviewed the resident's history and exam and pertinent patient test results.  I agree with the assessment, diagnosis, and plan of care documented in the resident's note.

## 2013-10-03 ENCOUNTER — Other Ambulatory Visit: Payer: Self-pay | Admitting: Internal Medicine

## 2013-10-03 NOTE — Telephone Encounter (Signed)
Called to [facility] 

## 2013-10-03 NOTE — Telephone Encounter (Signed)
Would you pls phone in the [facility]? Thanks

## 2013-11-06 ENCOUNTER — Encounter: Payer: Self-pay | Admitting: Internal Medicine

## 2013-11-06 ENCOUNTER — Ambulatory Visit (INDEPENDENT_AMBULATORY_CARE_PROVIDER_SITE_OTHER): Payer: 59 | Admitting: Internal Medicine

## 2013-11-06 VITALS — BP 143/67 | HR 69 | Temp 98.0°F | Wt 168.9 lb

## 2013-11-06 DIAGNOSIS — Z1239 Encounter for other screening for malignant neoplasm of breast: Secondary | ICD-10-CM

## 2013-11-06 DIAGNOSIS — M545 Low back pain, unspecified: Secondary | ICD-10-CM

## 2013-11-06 NOTE — Progress Notes (Signed)
   Subjective:    Patient ID: Sharon Barrett, female    DOB: 11-Apr-1950, 63 y.o.   MRN: [id number]  HPI Sharon Barrett is a pleasant 63 yr old woman with PMH of allergic rhinitis, depression, and anemia, who presents for evaluation of acute on chronic low back pain.  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 The pain has improved somewhat with Aleve and Tylenol as well as heating pad. The pain does not interfere with her daily activities, she denies bowel/bladder dysfunction.  She will be flying to [place] this week for her nephew's wedding and is concerned about having the pain during this trip. She states she will travel with a lumbar support pillow and will be able to pack her heating pad.    Review of Systems  Constitutional: Negative for fever, chills, activity change, appetite change and fatigue.  Genitourinary: Negative for dysuria, flank pain and difficulty urinating.  Musculoskeletal: Positive for back pain. Negative for gait problem.  Skin: Negative for color change, pallor and rash.  Neurological: Negative for weakness.  Hematological: Negative for adenopathy.  Psychiatric/Behavioral: Negative for agitation.       Objective:   Physical Exam  Nursing note and vitals reviewed. Constitutional: She is oriented to person, place, and time. She appears well-developed and well-nourished. No distress.  Cardiovascular: Normal rate.   Pulmonary/Chest: Effort normal. No respiratory distress.  Musculoskeletal: She exhibits tenderness. She exhibits no edema.  TTP of lumbar spine, normal spine flexion/enxtension, normal hip flexion/extension bilaterally, negative straight leg raise.   Neurological: She is alert and oriented to person, place, and time. She exhibits normal muscle tone. Coordination normal.  Strength 5/5 in bilateral  lower extremities with intact sensation to light touch bilaterally.   Skin: Skin is warm and dry. No rash noted. She is not diaphoretic. No erythema.  Psychiatric: She has a normal mood and affect. Her behavior is normal.          Assessment & Plan:

## 2013-11-06 NOTE — Patient Instructions (Addendum)
-  Continue applying heating pad to your back for 20 minutes at a time 4 times per day.  -Start doing the stretch back exercises per the pamphlet given to you.  -Continue taking Aleve and Tylenol for the pain. You may take up to 8 extra strength tablets per day of Tylenol.  -You have been referred to Physical Therapy for your pain.  -You have been referred for a mammogram.  -Follow up with [place] as needed if the back pain does not improve.

## 2013-11-07 DIAGNOSIS — Z0000 Encounter for general adult medical examination without abnormal findings: Secondary | ICD-10-CM | POA: Insufficient documentation

## 2013-11-07 NOTE — Assessment & Plan Note (Signed)
She was interested in scheduling her screening mammogram for this month. -Ordered mammogram.

## 2013-11-07 NOTE — Assessment & Plan Note (Signed)
Her back pain was worsened by exertion but is improving with Aleve/Tylenol, heating pads. MRI from 2005 showed anterolisthesis of L4 and L5 of 8-9 mm, disk bulge at L3-4 without herniation or stenosis, and mild disk bulge and facet degenerative disease at L5-S1 without suspicion of significant stenosis.  She denies alarming symptoms such as paraesthesias, weakness, loss of bowel/bladder function or radiculopathy.  -Will treat conservatively at this time with alternation of Aleve  and Tylenol (pt counseled to limit this to 4g/day), heating pad, and back stretch exercises (pt was given leaflet of exercises).  -Referred to PT--she will call them once she comes back from [place].

## 2013-11-08 NOTE — Progress Notes (Signed)
Case discussed with Dr. Kennerly soon after the resident saw the patient.  We reviewed the resident's history and exam and pertinent patient test results.  I agree with the assessment, diagnosis, and plan of care documented in the resident's note. 

## 2013-11-26 ENCOUNTER — Ambulatory Visit: Payer: 59 | Attending: Internal Medicine

## 2013-11-26 DIAGNOSIS — M25659 Stiffness of unspecified hip, not elsewhere classified: Secondary | ICD-10-CM | POA: Diagnosis not present

## 2013-11-26 DIAGNOSIS — M545 Low back pain, unspecified: Secondary | ICD-10-CM | POA: Insufficient documentation

## 2013-11-26 DIAGNOSIS — IMO0001 Reserved for inherently not codable concepts without codable children: Secondary | ICD-10-CM | POA: Diagnosis not present

## 2013-11-26 DIAGNOSIS — R293 Abnormal posture: Secondary | ICD-10-CM | POA: Diagnosis not present

## 2013-11-26 DIAGNOSIS — M25559 Pain in unspecified hip: Secondary | ICD-10-CM | POA: Diagnosis not present

## 2013-11-30 ENCOUNTER — Ambulatory Visit (HOSPITAL_COMMUNITY)
Admission: RE | Admit: 2013-11-30 | Discharge: 2013-11-30 | Disposition: A | Discharge status: Home / Self Care | Payer: 59 | Source: Ambulatory Visit | Attending: Internal Medicine | Admitting: Internal Medicine

## 2013-11-30 DIAGNOSIS — Z1231 Encounter for screening mammogram for malignant neoplasm of breast: Secondary | ICD-10-CM | POA: Diagnosis not present

## 2013-11-30 DIAGNOSIS — Z1239 Encounter for other screening for malignant neoplasm of breast: Secondary | ICD-10-CM

## 2013-12-03 ENCOUNTER — Ambulatory Visit: Payer: 59 | Admitting: Rehabilitation

## 2013-12-03 DIAGNOSIS — IMO0001 Reserved for inherently not codable concepts without codable children: Secondary | ICD-10-CM | POA: Diagnosis not present

## 2013-12-06 ENCOUNTER — Ambulatory Visit: Payer: 59 | Attending: Internal Medicine

## 2013-12-06 DIAGNOSIS — M25552 Pain in left hip: Secondary | ICD-10-CM | POA: Insufficient documentation

## 2013-12-06 DIAGNOSIS — M25652 Stiffness of left hip, not elsewhere classified: Secondary | ICD-10-CM | POA: Diagnosis not present

## 2013-12-06 DIAGNOSIS — Z5189 Encounter for other specified aftercare: Secondary | ICD-10-CM | POA: Diagnosis present

## 2013-12-06 DIAGNOSIS — R293 Abnormal posture: Secondary | ICD-10-CM | POA: Diagnosis not present

## 2013-12-06 DIAGNOSIS — M545 Low back pain: Secondary | ICD-10-CM | POA: Diagnosis not present

## 2013-12-10 ENCOUNTER — Ambulatory Visit: Payer: 59

## 2013-12-13 ENCOUNTER — Ambulatory Visit: Payer: 59

## 2013-12-13 DIAGNOSIS — Z5189 Encounter for other specified aftercare: Secondary | ICD-10-CM | POA: Diagnosis not present

## 2013-12-18 ENCOUNTER — Ambulatory Visit: Payer: 59 | Admitting: Physical Therapy

## 2014-01-09 ENCOUNTER — Ambulatory Visit (INDEPENDENT_AMBULATORY_CARE_PROVIDER_SITE_OTHER): Payer: 59 | Admitting: Internal Medicine

## 2014-01-09 VITALS — BP 137/63 | HR 65 | Temp 98.2°F | Wt 168.6 lb

## 2014-01-09 DIAGNOSIS — Z23 Encounter for immunization: Secondary | ICD-10-CM

## 2014-01-09 DIAGNOSIS — D509 Iron deficiency anemia, unspecified: Secondary | ICD-10-CM

## 2014-01-09 DIAGNOSIS — G4700 Insomnia, unspecified: Secondary | ICD-10-CM

## 2014-01-09 DIAGNOSIS — J309 Allergic rhinitis, unspecified: Secondary | ICD-10-CM

## 2014-01-09 DIAGNOSIS — M5442 Lumbago with sciatica, left side: Secondary | ICD-10-CM

## 2014-01-09 DIAGNOSIS — M5432 Sciatica, left side: Secondary | ICD-10-CM

## 2014-01-09 DIAGNOSIS — Z0000 Encounter for general adult medical examination without abnormal findings: Secondary | ICD-10-CM

## 2014-01-09 NOTE — Assessment & Plan Note (Signed)
Sxs are at baseline. Cont on med regimen.

## 2014-01-09 NOTE — Assessment & Plan Note (Signed)
She cont to use her Clonazepam QHS. Gives her 6 hrs sleep then wakes up and starts to worry and think about all she needs to do. Sometimes may fall asleep, sometimes not. Is OK with this regimen.

## 2014-01-09 NOTE — Assessment & Plan Note (Addendum)
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  This pain is different from prior pain. Flares since March and increasing in severity. Starts in L buttock lateral to mid line, goes to lateral hip, down anterior thigh, to knee and behind L knee, and down anterior shin. Doesn't go distal to Ankle. Hurts when sits or stands too long. Hurts at night but sometimes takes 1/4 or 1/2 Flexeril and it eases it off. Using Aleve, tylenol, ice, PT exercises and it is only tolerable despite max symptomatic, conservative tx. No bowel / bladder dysfxn. No weakness, tripping over toe.   On exam, Sharon Barrett is in no acute discomfort. She is able to stand, walk, get on and off exam table normally, independently, and without grimace. Strength LE equal B and 5/5. Point tenderness over L buttock lateral to midline and over lateral hip - kind of in trochanteric region. Weight is stable.  A/P Acute on chronic LBP with radiation to leg, increasing in severity despite 8 months max conservative tx. Seems to be L4 but not all sxs fit this dermatome. With prior back surgeries, will start MRI and base further rec on the findings.

## 2014-01-09 NOTE — Assessment & Plan Note (Signed)
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 HgB last check was 11.12 Apr 2013. Donated blood 11/1 and hgB was 12.7. Ferritin was 5 in Feb and now down to FeSO4 QD. Will need to check labs sometime over winter. No sxs of Fe Deficiency.

## 2014-01-09 NOTE — Progress Notes (Signed)
   Subjective:    Patient ID: Sharon Barrett, female    DOB: 1950-06-19, 63 y.o.   MRN: [id number]  HPI  Please see the A&P for the status of the pt's chronic medical problems.  Review of Systems  Constitutional: Positive for activity change. Negative for appetite change.  HENT:       Allergic sxs at baseline  Respiratory: Negative for shortness of breath.   Cardiovascular: Negative for chest pain.  Gastrointestinal: Negative for nausea, abdominal pain, diarrhea and constipation.  Genitourinary:       No bowel/bladder dysnfxn  Musculoskeletal: Positive for back pain and gait problem.  Neurological: Negative for weakness.  Psychiatric/Behavioral: Positive for sleep disturbance.       Objective:   Physical Exam  Constitutional: She appears well-developed and well-nourished. No distress.  HENT:  Head: Normocephalic and atraumatic.  Right Ear: External ear normal.  Left Ear: External ear normal.  Nose: Nose normal.  Eyes: Conjunctivae and EOM are normal.  Neurological:  See A&P for back exam  Skin: Skin is warm and dry. She is not diaphoretic.  Psychiatric: She has a normal mood and affect. Her behavior is normal. Judgment and thought content normal.          Assessment & Plan:

## 2014-01-09 NOTE — Patient Instructions (Signed)
Printing not working

## 2014-01-09 NOTE — Assessment & Plan Note (Signed)
Will retire in May and move to [facility] area. Wants to transition to [place] at [facility].  Prevnar 13 today.

## 2014-01-10 ENCOUNTER — Ambulatory Visit: Payer: 59 | Admitting: Internal Medicine

## 2014-01-11 ENCOUNTER — Other Ambulatory Visit: Payer: Self-pay | Admitting: Internal Medicine

## 2014-01-11 DIAGNOSIS — Z139 Encounter for screening, unspecified: Secondary | ICD-10-CM

## 2014-01-11 DIAGNOSIS — D509 Iron deficiency anemia, unspecified: Secondary | ICD-10-CM

## 2014-01-14 ENCOUNTER — Other Ambulatory Visit (INDEPENDENT_AMBULATORY_CARE_PROVIDER_SITE_OTHER): Payer: 59

## 2014-01-14 DIAGNOSIS — Z139 Encounter for screening, unspecified: Secondary | ICD-10-CM

## 2014-01-14 DIAGNOSIS — D509 Iron deficiency anemia, unspecified: Secondary | ICD-10-CM

## 2014-01-14 LAB — CBC
HCT: 34.8 % — ABNORMAL LOW (ref 36.0–46.0)
Hemoglobin: 11.5 g/dL — ABNORMAL LOW (ref 12.0–15.0)
MCH: 29.6 pg (ref 26.0–34.0)
MCHC: 33 g/dL (ref 30.0–36.0)
MCV: 89.7 fL (ref 78.0–100.0)
Platelets: 278 10*3/uL (ref 150–400)
RBC: 3.88 MIL/uL (ref 3.87–5.11)
RDW: 14.8 % (ref 11.5–15.5)
WBC: 4.7 10*3/uL (ref 4.0–10.5)

## 2014-01-14 LAB — BASIC METABOLIC PANEL WITH GFR
BUN: 13 mg/dL (ref 6–23)
CHLORIDE: 102 meq/L (ref 96–112)
CO2: 26 mEq/L (ref 19–32)
CREATININE: 0.7 mg/dL (ref 0.50–1.10)
Calcium: 9.7 mg/dL (ref 8.4–10.5)
GFR, Est African American: >89 mL/min
Glucose, Bld: 103 mg/dL — ABNORMAL HIGH (ref 70–99)
Potassium: 4.7 mEq/L (ref 3.5–5.3)
Sodium: 138 mEq/L (ref 135–145)

## 2014-01-14 LAB — FERRITIN: Ferritin: 40 ng/mL (ref 10–291)

## 2014-01-22 ENCOUNTER — Ambulatory Visit
Admission: RE | Admit: 2014-01-22 | Discharge: 2014-01-22 | Disposition: A | Discharge status: Home / Self Care | Payer: 59 | Source: Ambulatory Visit | Attending: Internal Medicine | Admitting: Internal Medicine

## 2014-01-22 ENCOUNTER — Other Ambulatory Visit: Payer: Self-pay | Admitting: Internal Medicine

## 2014-01-22 DIAGNOSIS — M5432 Sciatica, left side: Secondary | ICD-10-CM

## 2014-01-22 DIAGNOSIS — M5442 Lumbago with sciatica, left side: Secondary | ICD-10-CM

## 2014-01-22 MED ORDER — GADOBENATE DIMEGLUMINE 529 MG/ML IV SOLN
15.0000 mL | Freq: Once | INTRAVENOUS | Status: AC | PRN
  Administered 2014-01-22: 15 mL via INTRAVENOUS

## 2014-01-23 ENCOUNTER — Other Ambulatory Visit: Payer: Self-pay | Admitting: Internal Medicine

## 2014-01-25 ENCOUNTER — Telehealth: Payer: Self-pay | Admitting: *Deleted

## 2014-01-25 NOTE — Telephone Encounter (Signed)
Pt calls and states she is to have an appt w/ neuro and Lela is working on that, to ease pt's mind triage called the office and was told since [place] went through the referral co-or. Person pt's appt would have to be made through her also that person and both physicians of choice are out, i left a message for the ref person to call triage asap.

## 2014-01-28 ENCOUNTER — Ambulatory Visit (HOSPITAL_COMMUNITY): Payer: 59

## 2014-02-04 ENCOUNTER — Other Ambulatory Visit: Payer: Self-pay | Admitting: Neurological Surgery

## 2014-02-05 NOTE — Telephone Encounter (Signed)
DONE

## 2014-02-11 ENCOUNTER — Other Ambulatory Visit: Payer: Self-pay | Admitting: Internal Medicine

## 2014-02-11 MED ORDER — CYCLOBENZAPRINE HCL 10 MG PO TABS: 5.0000 mg | ORAL_TABLET | Freq: Three times a day (TID) | ORAL | Status: DC | PRN

## 2014-02-14 ENCOUNTER — Encounter (HOSPITAL_COMMUNITY): Payer: Self-pay

## 2014-02-14 ENCOUNTER — Encounter (HOSPITAL_COMMUNITY)
Admission: RE | Admit: 2014-02-14 | Discharge: 2014-02-14 | Disposition: A | Discharge status: Home / Self Care | Payer: 59 | Source: Ambulatory Visit | Attending: Neurological Surgery | Admitting: Neurological Surgery

## 2014-02-14 DIAGNOSIS — D649 Anemia, unspecified: Secondary | ICD-10-CM | POA: Insufficient documentation

## 2014-02-14 DIAGNOSIS — Z01812 Encounter for preprocedural laboratory examination: Secondary | ICD-10-CM | POA: Insufficient documentation

## 2014-02-14 HISTORY — DX: Personal history of colon polyps, unspecified: Z86.0100

## 2014-02-14 HISTORY — DX: Unspecified osteoarthritis, unspecified site: M19.90

## 2014-02-14 HISTORY — DX: Constipation, unspecified: K59.00

## 2014-02-14 HISTORY — DX: Other chronic pain: G89.29

## 2014-02-14 HISTORY — DX: Personal history of other infectious and parasitic diseases: Z86.19

## 2014-02-14 HISTORY — DX: Headache: R51

## 2014-02-14 HISTORY — DX: Personal history of other medical treatment: Z92.89

## 2014-02-14 HISTORY — DX: Headache, unspecified: R51.9

## 2014-02-14 HISTORY — DX: Other seasonal allergic rhinitis: J30.2

## 2014-02-14 HISTORY — DX: Anxiety disorder, unspecified: F41.9

## 2014-02-14 HISTORY — DX: Major depressive disorder, single episode, unspecified: F32.9

## 2014-02-14 HISTORY — DX: Other specified postprocedural states: R11.2

## 2014-02-14 HISTORY — DX: Unspecified cataract: H26.9

## 2014-02-14 HISTORY — DX: Other reaction to spinal and lumbar puncture: G97.1

## 2014-02-14 HISTORY — DX: Dorsalgia, unspecified: M54.9

## 2014-02-14 HISTORY — DX: Depression, unspecified: F32.A

## 2014-02-14 HISTORY — DX: Anemia, unspecified: D64.9

## 2014-02-14 HISTORY — DX: Other muscle spasm: M62.838

## 2014-02-14 HISTORY — DX: Personal history of colonic polyps: Z86.010

## 2014-02-14 HISTORY — DX: Other specified postprocedural states: Z98.890

## 2014-02-14 LAB — CBC
HCT: 35.5 % — ABNORMAL LOW (ref 36.0–46.0)
HEMOGLOBIN: 11.6 g/dL — AB (ref 12.0–15.0)
MCH: 30.4 pg (ref 26.0–34.0)
MCHC: 32.7 g/dL (ref 30.0–36.0)
MCV: 92.9 fL (ref 78.0–100.0)
PLATELETS: 225 10*3/uL (ref 150–400)
RBC: 3.82 MIL/uL — ABNORMAL LOW (ref 3.87–5.11)
RDW: 13.6 % (ref 11.5–15.5)
WBC: 4.3 10*3/uL (ref 4.0–10.5)

## 2014-02-14 LAB — SURGICAL PCR SCREEN
MRSA, PCR: NEGATIVE
Staphylococcus aureus: NEGATIVE

## 2014-02-14 NOTE — Progress Notes (Addendum)
  Pt doesn't have a cardiologist  Denies ever having an echo/heart cath/stress test  Denies EKG or CXR in past yr   Medical Md is Dr.Elizabeth Lynnae January

## 2014-02-14 NOTE — Progress Notes (Signed)
Kosher diet

## 2014-02-14 NOTE — Pre-Procedure Instructions (Signed)
Sharon Barrett  02/14/2014   Your procedure is scheduled on:  Tues, Dec 15 @ 7:45 AM  Report to [facility] Entrance A  at 5:45 AM.  Call this number if you have problems the morning of surgery: [phone]   Remember:   Do not eat food or drink liquids after midnight.   Take these medicines the morning of surgery with A SIP OF WATER: Clonazepam(Klonopin),Flonase(Fluticasone-if needed),Claritin(Loratadine),and Effexor(Venlafaxine)              Stop taking your Sudafed,Fish Oil,Ibuprofen,and Aspirin.Also any Vitamins or Herbal Meds. No Goody's,BC's,or Aleve.   Do not wear jewelry, make-up or nail polish.  Do not wear lotions, powders, or perfumes. You may wear deodorant.  Do not shave 48 hours prior to surgery.   Do not bring valuables to the hospital.  [facility] is not responsible                  for any belongings or valuables.               Contacts, dentures or bridgework may not be worn into surgery.  Leave suitcase in the car. After surgery it may be brought to your room.  For patients admitted to the hospital, discharge time is determined by your                treatment team.               Patients discharged the day of surgery will not be allowed to drive  home.    Special Instructions:  [facility] - Preparing for Surgery  Before surgery, you can play an important role.  Because skin is not sterile, your skin needs to be as free of germs as possible.  You can reduce the number of germs on you skin by washing with CHG (chlorahexidine gluconate) soap before surgery.  CHG is an antiseptic cleaner which kills germs and bonds with the skin to continue killing germs even after washing.  Please DO NOT use if you have an allergy to CHG or antibacterial soaps.  If your skin becomes reddened/irritated stop using the CHG and inform your nurse when you arrive at Short Stay.  Do not shave (including legs and underarms) for at least 48 hours prior to the first CHG shower.  You may shave  your face.  Please follow these instructions carefully:   1.  Shower with CHG Soap the night before surgery and the                                morning of Surgery.  2.  If you choose to wash your hair, wash your hair first as usual with your       normal shampoo.  3.  After you shampoo, rinse your hair and body thoroughly to remove the                      Shampoo.  4.  Use CHG as you would any other liquid soap.  You can apply chg directly       to the skin and wash gently with scrungie or a clean washcloth.  5.  Apply the CHG Soap to your body ONLY FROM THE NECK DOWN.        Do not use on open wounds or open sores.  Avoid contact with your eyes,  ears, mouth and genitals (private parts).  Wash genitals (private parts)       with your normal soap.  6.  Wash thoroughly, paying special attention to the area where your surgery        will be performed.  7.  Thoroughly rinse your body with warm water from the neck down.  8.  DO NOT shower/wash with your normal soap after using and rinsing off       the CHG Soap.  9.  Pat yourself dry with a clean towel.            10.  Wear clean pajamas.            11.  Place clean sheets on your bed the night of your first shower and do not        sleep with pets.  Day of Surgery  Do not apply any lotions/deoderants the morning of surgery.  Please wear clean clothes to the hospital/surgery center.     Please read over the following fact sheets that you were given: Pain Booklet, Coughing and Deep Breathing, MRSA Information and Surgical Site Infection Prevention

## 2014-02-18 MED ORDER — VANCOMYCIN HCL IN DEXTROSE 1-5 GM/200ML-% IV SOLN
1000.0000 mg | INTRAVENOUS | Status: AC
  Administered 2014-02-19: 1000 mg via INTRAVENOUS
  Filled 2014-02-18: qty 200

## 2014-02-18 NOTE — H&P (Addendum)
Chief complaint: Back and left lower extremity pain HOPI:                                                   Sharon Barrett is admitted for surgery today.  For a number of months now she has been getting increasing back and left lower extremity pain.  This has, however, gotten much more severe in the recent past, and just recently Ayris has had an MRI of the lumbar spine.  She brings this study with her and it demonstrates that her fusion of L4-L5 is nicely healed, but at L3-L4, she has what appears to be some spondylitic degeneration with hypertrophy of the medial aspect of the facet causing some foraminal stenosis at the L3-4 level on the left.  This also is combined with some subarticular stenosis which is present bilaterally, but the left-sided L4 nerve root appears to be getting the most of the irritability.  She feels her strength has been good, but she finds that sitting bothers substantially, and sleeping and resting is sometimes difficult to find a comfortable spot.  Standing and moving about seems to be the most comfortable for her.  Seriyah would like something definitive done if it is at all possible and I demonstrated the findings on the MRI.   Past medical history: Patient's general health has been good. She has some hyperlipidemia osteopenia depression occasional and insomnia Medication list includes Effexor 25 mg a day fatty acid supplements low dose aspirin vitamin D clonazepam 0.5 mg.        Systems review reveals that other than complaints of back pain and left lower external he pain and some weakness she is no significant complaints.       DATA:                                                  Today in the office, to further her workup, I obtained a lateral flexion/extension and a singular AP view.  The radiographs demonstrate that she has little motion between flexion/extension at L3-L4, where she does have some spondylitic degeneration, but there is no significant calcification posteriorly to suggest  that the hypertrophy that we see on the facet is all calcific.  It likely represents some soft tissue, although I am not certain that it represents a facet cyst.  The other areas of her lumbar spine also appear to be quite healthy.  PHYSICAL EXAMINATION:                    On examination, I note that Rowynn stands straight and erect.  She can toe- and heel-walk reasonably well.  She has no tenderness to palpation or percussion across her lumbar spine, and her mobility is quite good overall.       IMPRESSION/PLAN:                             Given the findings on the MRI, I have suggested to [facility] that ultimately she may wish to consider decompression of L3-4 on the left side via laminotomy and a foraminotomy.  I do not believe that the joint is degenerated severe  enough to warrant any fusion surgery, and I am hopeful that simple decompression should alleviate the worst of her radicular symptoms and give her good long-term relief.  The surgery can be done with, at most, an overnight stay, but usually we can do these as an outpatient.  I also indicated that we could consider an epidural steroid injection which should give her some transient relief, but Knox notes that given the fact that the pain has been crescendoing over a period of time she would be more inclined to do something definitive than something transient.  she is now admitted for surgical decompression

## 2014-02-19 ENCOUNTER — Ambulatory Visit (HOSPITAL_COMMUNITY): Payer: 59

## 2014-02-19 ENCOUNTER — Encounter (HOSPITAL_COMMUNITY)
Admission: RE | Disposition: A | Discharge status: Home / Self Care | Payer: Self-pay | Source: Ambulatory Visit | Attending: Neurological Surgery

## 2014-02-19 ENCOUNTER — Ambulatory Visit (HOSPITAL_COMMUNITY): Payer: 59 | Admitting: Anesthesiology

## 2014-02-19 ENCOUNTER — Ambulatory Visit (HOSPITAL_COMMUNITY)
Admission: RE | Admit: 2014-02-19 | Discharge: 2014-02-20 | Disposition: A | Discharge status: Home / Self Care | Payer: 59 | Source: Ambulatory Visit | Attending: Neurological Surgery | Admitting: Neurological Surgery

## 2014-02-19 ENCOUNTER — Encounter (HOSPITAL_COMMUNITY): Payer: Self-pay | Admitting: *Deleted

## 2014-02-19 DIAGNOSIS — Z419 Encounter for procedure for purposes other than remedying health state, unspecified: Secondary | ICD-10-CM

## 2014-02-19 DIAGNOSIS — G4489 Other headache syndrome: Secondary | ICD-10-CM | POA: Diagnosis not present

## 2014-02-19 DIAGNOSIS — Z981 Arthrodesis status: Secondary | ICD-10-CM | POA: Insufficient documentation

## 2014-02-19 DIAGNOSIS — M4726 Other spondylosis with radiculopathy, lumbar region: Secondary | ICD-10-CM | POA: Diagnosis not present

## 2014-02-19 DIAGNOSIS — Z880 Allergy status to penicillin: Secondary | ICD-10-CM | POA: Diagnosis not present

## 2014-02-19 DIAGNOSIS — M1990 Unspecified osteoarthritis, unspecified site: Secondary | ICD-10-CM | POA: Diagnosis not present

## 2014-02-19 DIAGNOSIS — M5116 Intervertebral disc disorders with radiculopathy, lumbar region: Secondary | ICD-10-CM | POA: Diagnosis not present

## 2014-02-19 DIAGNOSIS — M47816 Spondylosis without myelopathy or radiculopathy, lumbar region: Secondary | ICD-10-CM | POA: Diagnosis present

## 2014-02-19 HISTORY — PX: LUMBAR LAMINECTOMY/DECOMPRESSION MICRODISCECTOMY: SHX5026

## 2014-02-19 SURGERY — LUMBAR LAMINECTOMY/DECOMPRESSION MICRODISCECTOMY 1 LEVEL
Anesthesia: General | Site: Back | Laterality: Left

## 2014-02-19 MED ORDER — CYCLOBENZAPRINE HCL 10 MG PO TABS
5.0000 mg | ORAL_TABLET | Freq: Three times a day (TID) | ORAL | Status: DC | PRN
Start: 2014-02-19 — End: 2014-02-20

## 2014-02-19 MED ORDER — SODIUM CHLORIDE 0.9 % IV SOLN: 250.0000 mL | INTRAVENOUS | Status: DC

## 2014-02-19 MED ORDER — LIDOCAINE HCL (CARDIAC) 20 MG/ML IV SOLN
INTRAVENOUS | Status: DC | PRN
  Administered 2014-02-19 (×2): 50 mg via INTRAVENOUS

## 2014-02-19 MED ORDER — SODIUM CHLORIDE 0.9 % IJ SOLN
INTRAMUSCULAR | Status: AC
  Filled 2014-02-19: qty 10

## 2014-02-19 MED ORDER — CEFAZOLIN SODIUM 1-5 GM-% IV SOLN
1.0000 g | Freq: Three times a day (TID) | INTRAVENOUS | Status: AC
  Administered 2014-02-19 (×2): 1 g via INTRAVENOUS
  Filled 2014-02-19 (×2): qty 50

## 2014-02-19 MED ORDER — SENNA 8.6 MG PO TABS
1.0000 | ORAL_TABLET | Freq: Two times a day (BID) | ORAL | Status: DC
  Administered 2014-02-19 (×2): 8.6 mg via ORAL
  Filled 2014-02-19 (×4): qty 1

## 2014-02-19 MED ORDER — DOCUSATE SODIUM 100 MG PO CAPS
200.0000 mg | ORAL_CAPSULE | Freq: Every day | ORAL | Status: DC
  Filled 2014-02-19 (×2): qty 2

## 2014-02-19 MED ORDER — OXYCODONE HCL 5 MG PO TABS
ORAL_TABLET | ORAL | Status: AC
  Filled 2014-02-19: qty 1

## 2014-02-19 MED ORDER — KETOROLAC TROMETHAMINE 15 MG/ML IJ SOLN
15.0000 mg | Freq: Four times a day (QID) | INTRAMUSCULAR | Status: DC
  Administered 2014-02-19 – 2014-02-20 (×4): 15 mg via INTRAVENOUS
  Filled 2014-02-19 (×5): qty 1

## 2014-02-19 MED ORDER — ALUM & MAG HYDROXIDE-SIMETH 200-200-20 MG/5ML PO SUSP: 30.0000 mL | Freq: Four times a day (QID) | ORAL | Status: DC | PRN

## 2014-02-19 MED ORDER — ACETAMINOPHEN 325 MG PO TABS: 650.0000 mg | ORAL_TABLET | ORAL | Status: DC | PRN

## 2014-02-19 MED ORDER — SODIUM CHLORIDE 0.9 % IJ SOLN
3.0000 mL | Freq: Two times a day (BID) | INTRAMUSCULAR | Status: DC
  Administered 2014-02-19: 3 mL via INTRAVENOUS

## 2014-02-19 MED ORDER — HEMOSTATIC AGENTS (NO CHARGE) OPTIME
TOPICAL | Status: DC | PRN
  Administered 2014-02-19: 1 via TOPICAL

## 2014-02-19 MED ORDER — PHENOL 1.4 % MT LIQD: 1.0000 | OROMUCOSAL | Status: DC | PRN

## 2014-02-19 MED ORDER — OXYCODONE HCL 5 MG PO TABS
5.0000 mg | ORAL_TABLET | Freq: Once | ORAL | Status: AC | PRN
  Administered 2014-02-19: 5 mg via ORAL

## 2014-02-19 MED ORDER — DOCUSATE SODIUM 100 MG PO CAPS
100.0000 mg | ORAL_CAPSULE | Freq: Two times a day (BID) | ORAL | Status: DC
  Administered 2014-02-19: 100 mg via ORAL

## 2014-02-19 MED ORDER — ONDANSETRON HCL 4 MG/2ML IJ SOLN
INTRAMUSCULAR | Status: DC | PRN
  Administered 2014-02-19: 4 mg via INTRAVENOUS

## 2014-02-19 MED ORDER — THROMBIN 5000 UNITS EX SOLR
CUTANEOUS | Status: DC | PRN
  Administered 2014-02-19 (×2): 5000 [IU] via TOPICAL

## 2014-02-19 MED ORDER — DEXTROSE 5 % IV SOLN
500.0000 mg | Freq: Four times a day (QID) | INTRAVENOUS | Status: DC | PRN
  Filled 2014-02-19: qty 5

## 2014-02-19 MED ORDER — FLUTICASONE PROPIONATE 50 MCG/ACT NA SUSP
1.0000 | Freq: Two times a day (BID) | NASAL | Status: DC | PRN
Start: 2014-02-19 — End: 2014-02-20
  Filled 2014-02-19: qty 16

## 2014-02-19 MED ORDER — CLONAZEPAM 0.5 MG PO TABS: 0.5000 mg | ORAL_TABLET | Freq: Two times a day (BID) | ORAL | Status: DC | PRN

## 2014-02-19 MED ORDER — PROPOFOL 10 MG/ML IV BOLUS
INTRAVENOUS | Status: AC
  Filled 2014-02-19: qty 20

## 2014-02-19 MED ORDER — HYDROMORPHONE HCL 1 MG/ML IJ SOLN
INTRAMUSCULAR | Status: AC
  Filled 2014-02-19: qty 1

## 2014-02-19 MED ORDER — MORPHINE SULFATE 2 MG/ML IJ SOLN: 1.0000 mg | INTRAMUSCULAR | Status: DC | PRN

## 2014-02-19 MED ORDER — FENTANYL CITRATE 0.05 MG/ML IJ SOLN
INTRAMUSCULAR | Status: AC
  Filled 2014-02-19: qty 5

## 2014-02-19 MED ORDER — NEOSTIGMINE METHYLSULFATE 10 MG/10ML IV SOLN
INTRAVENOUS | Status: DC | PRN
  Administered 2014-02-19: 4 mg via INTRAVENOUS

## 2014-02-19 MED ORDER — MIDAZOLAM HCL 5 MG/5ML IJ SOLN
INTRAMUSCULAR | Status: DC | PRN
  Administered 2014-02-19: 2 mg via INTRAVENOUS

## 2014-02-19 MED ORDER — SODIUM CHLORIDE 0.9 % IR SOLN
Status: DC | PRN
  Administered 2014-02-19: 07:00:00

## 2014-02-19 MED ORDER — 0.9 % SODIUM CHLORIDE (POUR BTL) OPTIME
TOPICAL | Status: DC | PRN
  Administered 2014-02-19: 1000 mL

## 2014-02-19 MED ORDER — PROMETHAZINE HCL 25 MG/ML IJ SOLN: 6.2500 mg | INTRAMUSCULAR | Status: DC | PRN

## 2014-02-19 MED ORDER — ARTIFICIAL TEARS OP OINT
TOPICAL_OINTMENT | OPHTHALMIC | Status: DC | PRN
  Administered 2014-02-19: 1 via OPHTHALMIC

## 2014-02-19 MED ORDER — FLUTICASONE PROPIONATE 50 MCG/ACT NA SUSP
1.0000 | Freq: Every day | NASAL | Status: DC
  Filled 2014-02-19: qty 16

## 2014-02-19 MED ORDER — BUPIVACAINE HCL (PF) 0.5 % IJ SOLN
INTRAMUSCULAR | Status: DC | PRN
  Administered 2014-02-19: 10 mL

## 2014-02-19 MED ORDER — DEXAMETHASONE SODIUM PHOSPHATE 10 MG/ML IJ SOLN
INTRAMUSCULAR | Status: DC | PRN
  Administered 2014-02-19: 10 mg via INTRAVENOUS

## 2014-02-19 MED ORDER — METHOCARBAMOL 500 MG PO TABS
ORAL_TABLET | ORAL | Status: AC
  Filled 2014-02-19: qty 1

## 2014-02-19 MED ORDER — LACTATED RINGERS IV SOLN
INTRAVENOUS | Status: DC | PRN
  Administered 2014-02-19 (×2): via INTRAVENOUS

## 2014-02-19 MED ORDER — VENLAFAXINE HCL 25 MG PO TABS
25.0000 mg | ORAL_TABLET | Freq: Every day | ORAL | Status: DC
Start: 2014-02-19 — End: 2014-02-20
  Filled 2014-02-19 (×2): qty 1

## 2014-02-19 MED ORDER — SODIUM CHLORIDE 0.9 % IJ SOLN: 3.0000 mL | INTRAMUSCULAR | Status: DC | PRN

## 2014-02-19 MED ORDER — ONDANSETRON HCL 4 MG/2ML IJ SOLN: 4.0000 mg | INTRAMUSCULAR | Status: DC | PRN

## 2014-02-19 MED ORDER — PROPOFOL 10 MG/ML IV BOLUS
INTRAVENOUS | Status: DC | PRN
  Administered 2014-02-19: 10 mg via INTRAVENOUS
  Administered 2014-02-19: 150 mg via INTRAVENOUS
  Administered 2014-02-19: 10 mg via INTRAVENOUS

## 2014-02-19 MED ORDER — OXYCODONE HCL 5 MG/5ML PO SOLN: 5.0000 mg | Freq: Once | ORAL | Status: AC | PRN

## 2014-02-19 MED ORDER — MIDAZOLAM HCL 2 MG/2ML IJ SOLN
INTRAMUSCULAR | Status: AC
  Filled 2014-02-19: qty 2

## 2014-02-19 MED ORDER — HYDROMORPHONE HCL 1 MG/ML IJ SOLN
0.2500 mg | INTRAMUSCULAR | Status: DC | PRN
  Administered 2014-02-19 (×2): 0.5 mg via INTRAVENOUS

## 2014-02-19 MED ORDER — DEXAMETHASONE SODIUM PHOSPHATE 10 MG/ML IJ SOLN
INTRAMUSCULAR | Status: AC
  Filled 2014-02-19: qty 1

## 2014-02-19 MED ORDER — EPHEDRINE SULFATE 50 MG/ML IJ SOLN
INTRAMUSCULAR | Status: DC | PRN
  Administered 2014-02-19 (×3): 5 mg via INTRAVENOUS

## 2014-02-19 MED ORDER — LIDOCAINE-EPINEPHRINE 1 %-1:100000 IJ SOLN
INTRAMUSCULAR | Status: DC | PRN
  Administered 2014-02-19: 10 mL

## 2014-02-19 MED ORDER — ACETAMINOPHEN 650 MG RE SUPP: 650.0000 mg | RECTAL | Status: DC | PRN

## 2014-02-19 MED ORDER — EPHEDRINE SULFATE 50 MG/ML IJ SOLN
INTRAMUSCULAR | Status: AC
  Filled 2014-02-19: qty 1

## 2014-02-19 MED ORDER — FENTANYL CITRATE 0.05 MG/ML IJ SOLN
INTRAMUSCULAR | Status: DC | PRN
  Administered 2014-02-19: 100 ug via INTRAVENOUS
  Administered 2014-02-19: 50 ug via INTRAVENOUS

## 2014-02-19 MED ORDER — POLYETHYLENE GLYCOL 3350 17 G PO PACK
17.0000 g | PACK | Freq: Every day | ORAL | Status: DC | PRN
  Filled 2014-02-19: qty 1

## 2014-02-19 MED ORDER — GLYCOPYRROLATE 0.2 MG/ML IJ SOLN
INTRAMUSCULAR | Status: DC | PRN
  Administered 2014-02-19: 0.6 mg via INTRAVENOUS

## 2014-02-19 MED ORDER — BISACODYL 10 MG RE SUPP: 10.0000 mg | Freq: Every day | RECTAL | Status: DC | PRN

## 2014-02-19 MED ORDER — ROCURONIUM BROMIDE 100 MG/10ML IV SOLN
INTRAVENOUS | Status: DC | PRN
  Administered 2014-02-19: 50 mg via INTRAVENOUS

## 2014-02-19 MED ORDER — MENTHOL 3 MG MT LOZG
1.0000 | LOZENGE | OROMUCOSAL | Status: DC | PRN
  Filled 2014-02-19: qty 9

## 2014-02-19 MED ORDER — SCOPOLAMINE 1 MG/3DAYS TD PT72
MEDICATED_PATCH | TRANSDERMAL | Status: DC | PRN
  Administered 2014-02-19: 1 via TRANSDERMAL

## 2014-02-19 MED ORDER — KETOROLAC TROMETHAMINE 0.5 % OP SOLN
1.0000 [drp] | Freq: Four times a day (QID) | OPHTHALMIC | Status: DC
Start: 2014-02-19 — End: 2014-02-20
  Administered 2014-02-19 (×3): 1 [drp] via OPHTHALMIC
  Filled 2014-02-19: qty 3

## 2014-02-19 MED ORDER — METHOCARBAMOL 500 MG PO TABS
500.0000 mg | ORAL_TABLET | Freq: Four times a day (QID) | ORAL | Status: DC | PRN
  Administered 2014-02-19 – 2014-02-20 (×3): 500 mg via ORAL
  Filled 2014-02-19 (×2): qty 1

## 2014-02-19 MED ORDER — OXYCODONE-ACETAMINOPHEN 5-325 MG PO TABS
1.0000 | ORAL_TABLET | ORAL | Status: DC | PRN
  Administered 2014-02-19 – 2014-02-20 (×4): 2 via ORAL
  Filled 2014-02-19 (×4): qty 2

## 2014-02-19 MED ORDER — SCOPOLAMINE 1 MG/3DAYS TD PT72
MEDICATED_PATCH | TRANSDERMAL | Status: AC
  Filled 2014-02-19: qty 1

## 2014-02-19 SURGICAL SUPPLY — 53 items
BAG DECANTER FOR FLEXI CONT (MISCELLANEOUS) ×3 IMPLANT
BLADE CLIPPER SURG (BLADE) IMPLANT
BUR ACORN 6.0 (BURR) IMPLANT
BUR ACORN 6.0MM (BURR)
BUR MATCHSTICK NEURO 3.0 LAGG (BURR) ×3 IMPLANT
CANISTER SUCT 3000ML (MISCELLANEOUS) ×3 IMPLANT
CONT SPEC 4OZ CLIKSEAL STRL BL (MISCELLANEOUS) ×3 IMPLANT
DECANTER SPIKE VIAL GLASS SM (MISCELLANEOUS) ×3 IMPLANT
DRAPE LAPAROTOMY T 102X78X121 (DRAPES) ×3 IMPLANT
DRAPE MICROSCOPE LEICA (MISCELLANEOUS) ×2 IMPLANT
DRAPE POUCH INSTRU U-SHP 10X18 (DRAPES) ×3 IMPLANT
DRAPE PROXIMA HALF (DRAPES) IMPLANT
DURAPREP 26ML APPLICATOR (WOUND CARE) ×3 IMPLANT
ELECT REM PT RETURN 9FT ADLT (ELECTROSURGICAL) ×3
ELECTRODE REM PT RTRN 9FT ADLT (ELECTROSURGICAL) ×1 IMPLANT
GAUZE SPONGE 4X4 12PLY STRL (GAUZE/BANDAGES/DRESSINGS) ×3 IMPLANT
GAUZE SPONGE 4X4 16PLY XRAY LF (GAUZE/BANDAGES/DRESSINGS) IMPLANT
GLOVE BIOGEL PI IND STRL 7.0 (GLOVE) IMPLANT
GLOVE BIOGEL PI IND STRL 7.5 (GLOVE) IMPLANT
GLOVE BIOGEL PI IND STRL 8.5 (GLOVE) ×1 IMPLANT
GLOVE BIOGEL PI INDICATOR 7.0 (GLOVE) ×2
GLOVE BIOGEL PI INDICATOR 7.5 (GLOVE) ×2
GLOVE BIOGEL PI INDICATOR 8.5 (GLOVE) ×2
GLOVE ECLIPSE 7.0 STRL STRAW (GLOVE) ×2 IMPLANT
GLOVE ECLIPSE 8.5 STRL (GLOVE) ×3 IMPLANT
GLOVE EXAM NITRILE LRG STRL (GLOVE) IMPLANT
GLOVE EXAM NITRILE MD LF STRL (GLOVE) IMPLANT
GLOVE EXAM NITRILE XL STR (GLOVE) IMPLANT
GLOVE EXAM NITRILE XS STR PU (GLOVE) IMPLANT
GOWN STRL REUS W/ TWL LRG LVL3 (GOWN DISPOSABLE) IMPLANT
GOWN STRL REUS W/ TWL XL LVL3 (GOWN DISPOSABLE) IMPLANT
GOWN STRL REUS W/TWL 2XL LVL3 (GOWN DISPOSABLE) ×3 IMPLANT
GOWN STRL REUS W/TWL LRG LVL3 (GOWN DISPOSABLE) ×6
GOWN STRL REUS W/TWL XL LVL3 (GOWN DISPOSABLE)
KIT BASIN OR (CUSTOM PROCEDURE TRAY) ×3 IMPLANT
KIT ROOM TURNOVER OR (KITS) ×3 IMPLANT
LIQUID BAND (GAUZE/BANDAGES/DRESSINGS) ×3 IMPLANT
NEEDLE HYPO 22GX1.5 SAFETY (NEEDLE) ×3 IMPLANT
NEEDLE SPNL 20GX3.5 QUINCKE YW (NEEDLE) IMPLANT
NS IRRIG 1000ML POUR BTL (IV SOLUTION) ×3 IMPLANT
PACK LAMINECTOMY NEURO (CUSTOM PROCEDURE TRAY) ×3 IMPLANT
PAD ARMBOARD 7.5X6 YLW CONV (MISCELLANEOUS) ×9 IMPLANT
PATTIES SURGICAL .5 X1 (DISPOSABLE) ×3 IMPLANT
RUBBERBAND STERILE (MISCELLANEOUS) ×4 IMPLANT
SPONGE SURGIFOAM ABS GEL SZ50 (HEMOSTASIS) ×3 IMPLANT
SUT VIC AB 1 CT1 18XBRD ANBCTR (SUTURE) ×1 IMPLANT
SUT VIC AB 1 CT1 8-18 (SUTURE) ×3
SUT VIC AB 2-0 CP2 18 (SUTURE) ×3 IMPLANT
SUT VIC AB 3-0 SH 8-18 (SUTURE) ×3 IMPLANT
SYR 20ML ECCENTRIC (SYRINGE) ×3 IMPLANT
TOWEL OR 17X24 6PK STRL BLUE (TOWEL DISPOSABLE) ×3 IMPLANT
TOWEL OR 17X26 10 PK STRL BLUE (TOWEL DISPOSABLE) ×3 IMPLANT
WATER STERILE IRR 1000ML POUR (IV SOLUTION) ×3 IMPLANT

## 2014-02-19 NOTE — Anesthesia Preprocedure Evaluation (Addendum)
Anesthesia Evaluation  Patient identified by MRN, date of birth, ID band Patient awake    History of Anesthesia Complications (+) PONV  Airway Mallampati: I  TM Distance: >3 FB Neck ROM: Full    Dental  (+) Teeth Intact, Dental Advisory Given   Pulmonary neg pulmonary ROS,  breath sounds clear to auscultation        Cardiovascular negative cardio ROS  Rhythm:Regular Rate:Normal     Neuro/Psych  Headaches,    GI/Hepatic negative GI ROS, Neg liver ROS,   Endo/Other  negative endocrine ROS  Renal/GU negative Renal ROS     Musculoskeletal  (+) Arthritis -,   Abdominal   Peds  Hematology negative hematology ROS (+)   Anesthesia Other Findings   Reproductive/Obstetrics                            Anesthesia Physical Anesthesia Plan  ASA: II  Anesthesia Plan: General   Post-op Pain Management:    Induction: Intravenous  Airway Management Planned: Oral ETT  Additional Equipment:   Intra-op Plan:   Post-operative Plan: Extubation in OR  Informed Consent: I have reviewed the patients History and Physical, chart, labs and discussed the procedure including the risks, benefits and alternatives for the proposed anesthesia with the patient or authorized representative who has indicated his/her understanding and acceptance.   Dental advisory given  Plan Discussed with: CRNA and Surgeon  Anesthesia Plan Comments:         Anesthesia Quick Evaluation

## 2014-02-19 NOTE — Progress Notes (Signed)
Patient ID: Sharon Barrett, female   DOB: 1950/06/16, 63 y.o.   MRN: [id number] Vital signs are stable motor function appears intact. Incision is clean and dry. Patient is ambulating

## 2014-02-19 NOTE — Anesthesia Postprocedure Evaluation (Signed)
  Anesthesia Post-op Note  Patient: Sharon Barrett  Procedure(s) Performed: Procedure(s): LEFT Lumbar three-four LAMINECTOMY/FORAMINOTOMY (Left)  Patient Location: PACU  Anesthesia Type:General  Level of Consciousness: awake and alert   Airway and Oxygen Therapy: Patient Spontanous Breathing  Post-op Pain: mild  Post-op Assessment: Post-op Vital signs reviewed  Post-op Vital Signs: stable  Last Vitals:  Filed Vitals:   02/19/14 1050  BP:   Pulse: 65  Temp: 36.2 C  Resp: 15    Complications: No apparent anesthesia complications

## 2014-02-19 NOTE — Op Note (Signed)
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 Surgeon: Kristeen Miss Asst.: Consuella Lose M.D. Anesthesia: Gen. endotracheal Indications: The patient is a 63 year old individual who's had a previous decompression and fusion at L4-L5. About 6 months ago she started to develop lumbar radiculopathy involving the left lower extremity. This was initially treated conservatively but over the past several months has become increasingly refractory to treatment and she's been experiencing some left lower extremity weakness. 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 She is now taken to the operating room.  Procedure: The patient was brought to the operating room supine on a stretcher. After the smooth induction of general endotracheal anesthesia, she was turned prone. The back was prepped with alcohol and DuraPrep and draped in a sterile fashion. Localizing radiograph was taken to identify L3-L4. A small vertical incision was created in the bed of the old scar from previous fusion. This was carried down to the lumbar dorsal fascia. The fascia was opened on the left side of the midline. A subperiosteal dissection was carried out in the interlaminar space at L3-L4. The screw at L4 was identified. This guided the anatomy and the laminar arch of L3 was identified isolated and a laminectomy was created removing the inferior margin lamina of L3 out to the medial wall the facet at L3-4. This was on the left side. A  high-speed bur was used to remove the laminar arch and then the superior mesial portion of the facet was resected from the L4 superior articular process. The yellow ligament was taken up. The common dural tube was explored. Superiorly there was noted to be significant mass from a thickened redundant yellow ligament with some cystic structure. This was resected and allowed for good decompression of the lateral aspect of the dural tube and the take off of the L3 nerve root. The L3 nerve root foramen was identified and this was well decompressed. The path of the L4 nerve root was then decompressed by removing the superior articular process further. The underlying disc was noted to be bulging dorsally. This elevated the L4 nerve root. The L4 nerve root was then mobilized medially. The underlying mass was noted to be soft and consistent with a subligamentous disc herniation. A small vertical incision was made in the ligament. Several fragments of disc were mobilized from under ligament. This opening was contiguous with the disc space. There was several fragments of loose disc material within the disc space. These were mobilized and removed. A discectomy was then performed removing substantial quantities of markedly degenerated disc material from within the disc space. In the end the common dural tube the L4 nerve root the L3 nerve root were well decompressed. Hemostasis in the epidural space was obtained with some pledgets of Gelfoam soaked in thrombin which were later irrigated away. Hemostasis was then assured in the surgical bed and the microscope was removed. Lumbar dorsal fascia was closed with #1 Vicryl. 2-0 Vicryl was used in the subcutaneous tissues, 3-0 Vicryl to close subcuticular skin. Dermabond was placed on the skin. Patient tolerated the procedure well was returned to recovery room in stable  condition.

## 2014-02-19 NOTE — Evaluation (Signed)
Occupational Therapy Evaluation Patient Details Name: Sharon Barrett MRN: [id number] DOB: 1951/02/27 Today's Date: 02/19/2014    History of Present Illness 63 yo female s/p L3-4 laminectomy and discectomy PMH: depression, insomnia, osetopenia   Clinical Impression   Pt s/p above. Education provided during session. Pt verbalized understanding and pt will have spouse to assist her for first week. OT signing off.     Follow Up Recommendations  No OT follow up;Supervision - Intermittent    Equipment Recommendations  None recommended by OT    Recommendations for Other Services       Precautions / Restrictions Precautions Precautions: Back Precaution Booklet Issued: Yes (comment) Precaution Comments: educated on precautions Restrictions Weight Bearing Restrictions: No      Mobility Bed Mobility Overal bed mobility: Needs Assistance Bed Mobility: Sidelying to Sit;Sit to Sidelying   Sidelying to sit: Supervision     Sit to sidelying: Supervision General bed mobility comments: cues for technique.  Transfers Overall transfer level: Needs assistance   Transfers: Sit to/from Stand Sit to Stand: Min guard         General transfer comment: cues for technique.    Balance                                            ADL Overall ADL's : Needs assistance/impaired     Grooming: Wash/dry hands;Standing;Set up;Supervision/safety               Lower Body Dressing: Min guard;Sit to/from stand   Toilet Transfer: Min guard;Ambulation;Comfort height toilet;Grab bars   Toileting- Clothing Manipulation and Hygiene: Minimal assistance;Sit to/from stand Toileting - Clothing Manipulation Details (indicate cue type and reason): pt performed hygiene-but cues for precautions     Functional mobility during ADLs: Min guard General ADL Comments: Educated on LB dressing techniques. Pt able to cross legs for LB ADLs. Educated on AE if LB ADLs become an issue as  well as what pt can use for toilet aide. Educated on use of cup for oral care and placement of grooming items to avoid breaking precautions. Educated on positioning of pillows/sitting in recliner at home. Discussed what pt could use for shower chair. Recommended spouse be with pt for shower transfer and bathing for a little while. Discussed incorporating back precautions into functional activities.      Vision                     Perception     Praxis      Pertinent Vitals/Pain Pain Assessment: 0-10 Pain Score: 6  Pain Location: back Pain Intervention(s): Monitored during session;Repositioned     Hand Dominance Left   Extremity/Trunk Assessment Upper Extremity Assessment Upper Extremity Assessment: Overall WFL for tasks assessed   Lower Extremity Assessment Lower Extremity Assessment: Defer to PT evaluation       Communication Communication Communication: No difficulties   Cognition Arousal/Alertness: Awake/alert Behavior During Therapy: WFL for tasks assessed/performed Overall Cognitive Status: Within Functional Limits for tasks assessed                     General Comments       Exercises       Shoulder Instructions      Home Living Family/patient expects to be discharged to:: Private residence Living Arrangements: Spouse/significant other Available Help at Discharge: Family;Available 24 hours/day (for 1  week) Type of Home: House Home Access: Stairs to enter [employer] of Steps: 13 Entrance Stairs-Rails: None (wall close) Home Layout: Two level Alternate Level Stairs-Number of Steps: 18-20 Alternate Level Stairs-Rails: Left Bathroom Shower/Tub: Tub/shower unit;Walk-in shower   Bathroom Toilet: Standard (sink close)     Home Equipment: Cane - single point;Adaptive equipment Adaptive Equipment: Reacher;Other (Comment) (may have long shoehorn?)        Prior Functioning/Environment Level of Independence: Independent              OT Diagnosis: Acute pain   OT Problem List:     OT Treatment/Interventions:      OT Goals(Current goals can be found in the care plan section)    OT Frequency:     Barriers to D/C:            Co-evaluation              End of Session Equipment Utilized During Treatment: Gait belt  Activity Tolerance: Patient tolerated treatment well Patient left: in bed;with call bell/phone within reach   Time: 0347-4259 OT Time Calculation (min): 33 min Charges:  OT General Charges $OT Visit: 1 Procedure OT Evaluation $Initial OT Evaluation Tier I: 1 Procedure OT Treatments $Self Care/Home Management : 8-22 mins G-Codes: OT G-codes **NOT FOR INPATIENT CLASS** Functional Assessment Tool Used: clinical judgment Functional Limitation: Self care Self Care Current Status (D6387): At least 1 percent but less than 20 percent impaired, limited or restricted Self Care Goal Status (F6433): At least 1 percent but less than 20 percent impaired, limited or restricted Self Care Discharge Status [phone]): At least 1 percent but less than 20 percent impaired, limited or restricted  Benito Mccreedy OTR/L [phone] 02/19/2014, 3:52 PM

## 2014-02-19 NOTE — Transfer of Care (Signed)
Immediate Anesthesia Transfer of Care Note  Patient: Sharon Barrett  Procedure(s) Performed: Procedure(s): LEFT Lumbar three-four LAMINECTOMY/FORAMINOTOMY (Left)  Patient Location: PACU  Anesthesia Type:General  Level of Consciousness: patient cooperative and responds to stimulation  Airway & Oxygen Therapy: Patient Spontanous Breathing and Patient connected to nasal cannula oxygen, OPA  Post-op Assessment: Report given to PACU RN and Post -op Vital signs reviewed and stable  Post vital signs: Reviewed and stable  Complications: No apparent anesthesia complications

## 2014-02-20 ENCOUNTER — Encounter (HOSPITAL_COMMUNITY): Payer: Self-pay | Admitting: Neurological Surgery

## 2014-02-20 DIAGNOSIS — M5116 Intervertebral disc disorders with radiculopathy, lumbar region: Secondary | ICD-10-CM | POA: Diagnosis not present

## 2014-02-20 MED ORDER — HYDROCODONE-ACETAMINOPHEN 5-325 MG PO TABS: 1.0000 | ORAL_TABLET | ORAL | Status: DC | PRN

## 2014-02-20 MED ORDER — VITAMIN D3 50 MCG (2000 UT) PO CAPS: 2000.0000 [IU] | ORAL_CAPSULE | Freq: Every day | ORAL | Status: DC

## 2014-02-20 MED ORDER — DIAZEPAM 5 MG PO TABS: 5.0000 mg | ORAL_TABLET | Freq: Four times a day (QID) | ORAL | Status: DC | PRN

## 2014-02-20 NOTE — Discharge Summary (Signed)
Physician Discharge Summary  Patient ID: Sharon Barrett MRN: [id number] DOB/AGE: 1950/10/02 63 y.o.  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 She tolerated surgery well. Postoperatively she's had improved function in her left lower extremity with decreased pain.  Consults: None  Significant Diagnostic Studies: None  Treatments: surgery: Microdiscectomy L3-4 left with laminotomy and foraminotomy decompression of L3 and L4 nerve roots  Discharge Exam: Blood pressure 141/47, pulse 77, temperature 98.4 F (36.9 C), temperature source Oral, resp. rate 16, SpO2 97 %. Incision is clean and dry, motor function is intact in lower extremities  Disposition: 01-Home or Self Care  Discharge Instructions    Call MD for:  redness, tenderness, or signs of infection (pain, swelling, redness, odor or green/yellow discharge around incision site)    Complete by:  As directed      Call MD for:  severe uncontrolled pain    Complete by:  As directed      Call MD for:  temperature >100.4    Complete by:  As directed      Diet - low sodium heart healthy    Complete by:  As directed      Discharge instructions    Complete by:  As directed   Okay to shower. Do not apply salves or appointments to incision. No heavy lifting with the upper extremities greater than 15 pounds. May resume driving when not requiring pain medication and patient feels comfortable with doing so.     Increase activity slowly    Complete by:  As directed             Medication List    TAKE these medications        acetaminophen 500 MG tablet  Commonly known as:  TYLENOL   Take 500 mg by mouth every 6 (six) hours as needed (for pain).     aspirin EC 81 MG tablet  Take 81 mg by mouth daily.     ASPIR-LOW 81 MG EC tablet  Generic drug:  aspirin  TAKE 1 TABLET BY MOUTH DAILY     calcium carbonate 600 MG Tabs tablet  Commonly known as:  CALCIUM 600  Take 2 tablets (1,200 mg total) by mouth daily.     clonazePAM 0.5 MG tablet  Commonly known as:  KLONOPIN  Take 0.5 mg by mouth 2 (two) times daily as needed for anxiety.     clonazePAM 0.5 MG tablet  Commonly known as:  KLONOPIN  TAKE 1 TABLET BY MOUTH TWICE DAILY AS NEEDED FOR ANXIETY     cyclobenzaprine 10 MG tablet  Commonly known as:  FLEXERIL  Take 0.5 tablets (5 mg total) by mouth 3 (three) times daily as needed for muscle spasms.     diazepam 5 MG tablet  Commonly known as:  VALIUM  Take 1 tablet (5 mg total) by mouth every 6 (six) hours as needed for muscle spasms.     docusate sodium 100 MG capsule  Commonly known as:  COLACE  Take 2 capsules (200 mg total) by mouth daily.     ferrous sulfate 325 (65 FE) MG tablet  Take 1 tablet (325 mg total) by mouth daily. Increase to TID with meals as  tolerated.     Fish Oil 1200 MG Caps  Take 1 capsule (1,200 mg total) by mouth daily.     fluticasone 50 MCG/ACT nasal spray  Commonly known as:  FLONASE  Place 1 spray into both nostrils 2 (two) times daily as needed for allergies or rhinitis.     fluticasone 50 MCG/ACT nasal spray  Commonly known as:  FLONASE  USE 1 SPRAY IN EACH NOSTRIL TWICE DAILY AS NEEDED     HYDROcodone-acetaminophen 5-325 MG per tablet  Commonly known as:  NORCO  Take 1-2 tablets by mouth every 4 (four) hours as needed for moderate pain.     ibuprofen 200 MG tablet  Commonly known as:  ADVIL,MOTRIN  Take 200 mg by mouth every 6 (six) hours as needed (for pain).     Loratadine 10 MG Caps  Take 1 capsule (10 mg total) by mouth daily.     phenylephrine 10 MG Tabs tablet  Commonly known as:  SUDAFED PE  Take 1 tablet  (10 mg total) by mouth every 4 (four) hours as needed.     SENIOR MULTIVITAMIN PLUS Tabs  Take 1 tablet by mouth daily.     venlafaxine 25 MG tablet  Commonly known as:  EFFEXOR  Take 25 mg by mouth daily.     venlafaxine 25 MG tablet  Commonly known as:  EFFEXOR  TAKE 1 TABLET BY MOUTH DAILY.      ASK your doctor about these medications        Vitamin D3 2000 UNITS capsule  Take 1 capsule (2,000 Units total) by mouth daily.         SignedEarleen Newport 02/20/2014, 9:20 AM

## 2014-02-20 NOTE — Progress Notes (Signed)
Pt doing well. Pt and family given D/C instructions with Rx's, verbal understanding was provided. Pt's incision is clean and dry with no sign of infection, it is closed with dermabond. Pt's IV was removed prior to D/C. Pt D/C'd home via wheelchair with husband per MD order. Pt is stable @ D/C and has no other needs at this time. Holli Humbles, RN

## 2014-02-20 NOTE — Evaluation (Signed)
Physical Therapy Evaluation and DISCHARGE Patient Details Name: Sharon Barrett MRN: [id number] DOB: 1950/11/20 Today's Date: 02/20/2014   History of Present Illness  63 yo female s/p L3-4 laminectomy and discectomy PMH: depression, insomnia, osetopenia  Clinical Impression  Pt doing well with all aspects of mobility.  Pt with good recall of back precautions from OT eval yesterday.  Answered all questions pt and husband had.  Pt able to ambulate and perform stairs with S.  All education complete.  Will d/c from skilled PT services.    Follow Up Recommendations No PT follow up    Equipment Recommendations  None recommended by PT    Recommendations for Other Services       Precautions / Restrictions Precautions Precautions: Back Precaution Comments: educated on precautions Restrictions Weight Bearing Restrictions: No      Mobility  Bed Mobility   Bed Mobility: Rolling;Sidelying to Sit;Sit to Sidelying Rolling: Supervision Sidelying to sit: Supervision     Sit to sidelying: Supervision General bed mobility comments: cues for technique.  Husband educated as well  Transfers Overall transfer level: Needs assistance Equipment used: None Transfers: Sit to/from Stand Sit to Stand: Supervision            Ambulation/Gait Ambulation/Gait assistance: Supervision Ambulation Distance (Feet): 250 Feet Assistive device: None Gait Pattern/deviations: WFL(Within Functional Limits) Gait velocity: slightly decreased   General Gait Details: Slightly guarded. no LOB or balance deficits noted.  Stairs Stairs: Yes Stairs assistance: Supervision Stair Management: One rail Right Number of Stairs: 10 General stair comments: Ascended without rail and descended with rail  Wheelchair Mobility    Modified Rankin (Stroke Patients Only)       Balance Overall balance assessment: No apparent balance deficits (not formally assessed)                                           Pertinent Vitals/Pain Pain Assessment: Faces Faces Pain Scale: Hurts a little bit Pain Intervention(s): Monitored during session    Home Living Family/patient expects to be discharged to:: Private residence Living Arrangements: Spouse/significant other Available Help at Discharge: Family;Available 24 hours/day (for 1 week) Type of Home: House Home Access: Stairs to enter Entrance Stairs-Rails: None (wall close) Entrance Stairs-Number of Steps: 13 Home Layout: Two level Home Equipment: Cane - single point;Adaptive equipment      Prior Function Level of Independence: Independent               Hand Dominance   Dominant Hand: Left    Extremity/Trunk Assessment   Upper Extremity Assessment: Defer to OT evaluation           Lower Extremity Assessment: Overall WFL for tasks assessed      Cervical / Trunk Assessment: Normal  Communication   Communication: No difficulties  Cognition Arousal/Alertness: Awake/alert Behavior During Therapy: WFL for tasks assessed/performed Overall Cognitive Status: Within Functional Limits for tasks assessed                      General Comments      Exercises        Assessment/Plan    PT Assessment Patent does not need any further PT services  PT Diagnosis Difficulty walking   PT Problem List    PT Treatment Interventions     PT Goals (Current goals can be found in the Care Plan section) Acute  Rehab PT Goals PT Goal Formulation: All assessment and education complete, DC therapy    Frequency     Barriers to discharge        Co-evaluation               End of Session   Activity Tolerance: Patient tolerated treatment well Patient left: in bed;with family/visitor present;with call bell/phone within reach Nurse Communication: Mobility status    Functional Limitation: Mobility: Walking and moving around Mobility: Walking and Moving Around Current Status (I7125): At least 1 percent but  less than 20 percent impaired, limited or restricted Mobility: Walking and Moving Around Goal Status [phone]): At least 1 percent but less than 20 percent impaired, limited or restricted Mobility: Walking and Moving Around Discharge Status [phone]): At least 1 percent but less than 20 percent impaired, limited or restricted    Time: 3014-9969 PT Time Calculation (min) (ACUTE ONLY): 17 min   Charges:     PT Treatments $Gait Training: 8-22 mins   PT G Codes:     Functional Limitation: Mobility: Walking and moving around    [place] 02/20/2014, 10:36 AM

## 2014-02-20 NOTE — Discharge Instructions (Signed)
Wound Care °Leave incision open to air. °You may shower. °Do not scrub directly on incision.  °Do not put any creams, lotions, or ointments on incision. °Activity °Walk each and every day, increasing distance each day. °No lifting greater than 5 lbs.  Avoid excessive neck motion. °No driving for 2 weeks; may ride as a passenger locally. °Wear neck brace at all times except when showering.  If provided soft collar, may wear for comfort unless otherwise instructed. °Diet °Resume your normal diet.  °Return to Work °Will be discussed at you follow up appointment. °Call Your Doctor If Any of These Occur °Redness, drainage, or swelling at the wound.  °Temperature greater than 101 degrees. °Severe pain not relieved by pain medication. °Increased difficulty swallowing. °Incision starts to come apart. °Follow Up Appt °Call today for appointment in 4 weeks ([phone]) or for problems.  If you have any hardware placed in your spine, you will need an x-ray before your appointment. ° ° ° °Laminectomy - Laminotomy - Discectomy °Your surgeon has decided that a laminectomy (entire lamina removal) or laminotomy (partial lamina removal) is the best treatment for your back problem. These procedures involve removal of bone to relieve pressure on nerve roots. It allows the surgeon access to parts of the spine where other problems are located. This could be an injured disc (the cartilage-like structures located between the bones of the back). In this surgery your surgeon removes a part of the boney arch that surrounds your spinal canal. This may be compressing nerve roots. In some cases, the surgeon will remove the disc and fuse (stick together) vertebral bodies (the bones of your back) to make the spine more stable. The type of procedure you will need is usually decided prior to surgery, however modifications may be necessary. The time in surgery depends on the findings in surgery and the procedure necessary to correct the  problems. °DISCECTOMY °For people with disc problems, the surgeon removes the portion of the disc that is causing the pressure on the nerve root. Some surgeons perform a micro (small) discectomy, which may require removal of only a small portion of the lamina. A disc nucleus (center) may also be removed either through a needle (percutaneous discectomy) or by injecting an enzyme called chymopapain into the disc. Chymopapain is an enzyme that dissolves the disc. For people with back instability, the surgeon fuses vertebrae that are next to each other with tiny pieces of bone. These are used as bone grafts on the facets, or between the vertebrae. When this heals, the bones will no longer be able to move. These bone chips are often taken from the pelvic bones. Bones and bone grafts grow into one unit, stabilizing the segments of the spinal column. °LET YOUR CAREGIVER KNOW ABOUT: °· Allergies. °· Medicines taken including herbs, eyedrops, over-the-counter medicines, and creams. °· Use of steroids (by mouth or creams). °· Previous problems with anesthetics or numbing medicine. °· Possibility of pregnancy, if this applies. °· History of blood clots (thrombophlebitis). °· History of bleeding or blood problems. °· Previous surgery. °· Other health problems. °RISKS AND COMPLICATIONS °Your caregiver will discuss possible risks and complications with you before surgery. In addition to the usual risks of anesthesia, other common risks and complications include: °· Blood loss and replacement. °· Temporary increase in pain due to surgery. °· Uncorrected back pain. °· Infection. °· New nerve damage (tingling, numbness, and pain). °BEFORE THE PROCEDURE °· Stop smoking at least 1 week prior to surgery. This lowers   risk during surgery. °· Your caregiver may advise that you stop taking certain medicines that may affect the outcome of the surgery and your ability to heal. For example, you may need to stop taking anti-inflammatories,  such as aspirin, because of possible bleeding problems. Other medicines may have interactions with anesthesia. °· Tell your caregiver if you have been on steroids for long periods of time. Often, additional steroids are administered intravenously before and during the procedure to prevent complications. °· You should be present 60 minutes prior to your procedure or as directed. °AFTER THE PROCEDURE °After surgery, you will be taken to the recovery area where a nurse will watch and check your progress. Generally, you will be allowed to go home within 1 week barring other problems. °HOME CARE INSTRUCTIONS  °· Check the surgical cut (incision) twice a day for signs of infection. Some signs may include a bad smelling, greenish or yellowish discharge from the wound; increased pain or increased redness over the incision site; an opening of the incision; flu-like symptoms; or a temperature above 101.5° F (38.6° C). °· Change your bandages in about 24 to 36 hours following surgery or as directed. °· You may shower once the bandage is removed or as directed. Avoid bathtubs, swimming pools, and hot tubs for 3 weeks or until your incision has healed completely. If you have stitches (sutures) or staples they may be removed 2 to 3 weeks after surgery, or as directed by your caregiver. °· Follow your caregiver's instructions for activities, exercises, and physical therapy. °· Weight reduction may be beneficial if you are overweight. °· Walking is permitted. You may use a treadmill without an incline. Cut down on activities if you have discomfort. You may also go up and down stairs as tolerated. °· Do not lift anything heavier than 10 to 15 pounds. Avoid bending or twisting at the waist. Always bend your knees. °· Maintain strength and range of motion as instructed. °· No driving is permitted for 2 to 3 weeks, or as directed by your caregiver. You may be a passenger for 20 to 30 minute trips. Lying back in the passenger seat may  be more comfortable for you. °· Limit your sitting to 20 to 30 minute intervals. You should lie down or walk in between sitting periods. There are no limitations for sitting in a recliner chair. °· Only take over-the-counter or prescription medicines for pain, discomfort, or fever as directed by your caregiver. °SEEK MEDICAL CARE IF:  °· There is increased bleeding (more than a small spot) from the wound. °· You notice redness, swelling, or increasing pain in the wound. °· Pus is coming from the wound. °· An unexplained oral temperature above 102° F (38.9° C) develops. °· You notice a bad smell coming from the wound or dressing. °SEEK IMMEDIATE MEDICAL CARE IF:  °· You develop a rash. °· You have difficulty breathing. °· You have any allergic problems. °Document Released: 02/20/2000 Document Revised: 07/09/2013 Document Reviewed: 12/19/2007 °[employer]® Patient Information ©2015 [employer]. This information is not intended to replace advice given to you by your health care provider. Make sure you discuss any questions you have with your health care provider. ° °

## 2014-03-18 ENCOUNTER — Ambulatory Visit: Payer: 59 | Attending: Neurological Surgery | Admitting: Physical Therapy

## 2014-03-18 DIAGNOSIS — M25552 Pain in left hip: Secondary | ICD-10-CM | POA: Insufficient documentation

## 2014-03-18 DIAGNOSIS — M5416 Radiculopathy, lumbar region: Secondary | ICD-10-CM

## 2014-03-18 DIAGNOSIS — M545 Low back pain: Secondary | ICD-10-CM | POA: Insufficient documentation

## 2014-03-18 DIAGNOSIS — R293 Abnormal posture: Secondary | ICD-10-CM | POA: Diagnosis not present

## 2014-03-18 DIAGNOSIS — M25652 Stiffness of left hip, not elsewhere classified: Secondary | ICD-10-CM | POA: Insufficient documentation

## 2014-03-18 DIAGNOSIS — Z9889 Other specified postprocedural states: Secondary | ICD-10-CM

## 2014-03-18 DIAGNOSIS — Z5189 Encounter for other specified aftercare: Secondary | ICD-10-CM | POA: Insufficient documentation

## 2014-03-18 NOTE — Therapy (Signed)
[place], [place], [address] Phone: [phone]   Fax:  [phone]  Physical Therapy Evaluation  Patient Details  Name: Sharon Barrett MRN: [id number] Date of Birth: 02-01-1951 Referring Provider:  Kristeen Miss, MD  Encounter Date: 03/18/2014      PT End of Session - 03/18/14 1452    Visit Number 1   Number of Visits 12   Date for PT Re-Evaluation 04/29/14   PT Start Time 1330   PT Stop Time 1415   PT Time Calculation (min) 45 min   Activity Tolerance Patient tolerated treatment well   Behavior During Therapy [facility] for tasks assessed/performed      Past Medical History  Diagnosis Date  . Muscle spasm     takes Flexeril daily as needed  . Constipation     takes Colace daily  . Depression     takes Effexor daily  . Seasonal allergies     takes Claritin daily and uses Flonase daily as needed  . PONV (postoperative nausea and vomiting)   . Spinal headache 1979    had a spinal tap;no blood patch required  . Sinus headache     occasionally  . Arthritis     right hand  . Chronic back pain     spondylosis/radiculopathy/stenosis.  Marland Kitchen History of colon polyps     benign  . Anemia     takes Ferrous Gluconate daily  . History of blood transfusion     no abnormal reaction noted  . Cataracts, bilateral     immature   . Anxiety     takes Klonopin daily as needed  . History of shingles     Past Surgical History  Procedure Laterality Date  . Spine surgery  [id number]  . Anal fissure repair  0737    with complicated peri-rectal abscess  . Colposcopy  in 1980's    for abnormal PAP   . Bladder stricture   1966    repaired  . Tonsillectomy  1955  . Tubal ligation  1981  . Colonoscopy    . Esophagogastroduodenoscopy    . Lumbar laminectomy/decompression microdiscectomy Left 02/19/2014    Procedure: LEFT Lumbar three-four LAMINECTOMY/FORAMINOTOMY;  Surgeon: Kristeen Miss, MD;  Location: [facility];  Service: Neurosurgery;  Laterality: Left;    There were no vitals taken for this visit.  Visit Diagnosis:  S/P lumbar laminectomy - Plan: PT plan of care cert/re-cert  Lumbar radiculopathy, chronic - Plan: PT plan of care cert/re-cert      Subjective Assessment - 03/18/14 1340    Symptoms Pt had surgery 02-20-15 for HNP  Pt is walking now post surgery .  Pain running down Left leg now to knee ( better than to foot pre surgery)   Limitations Sitting;Standing;Walking  sitting is the worst   How long can you sit comfortably? 10   How long can you stand comfortably? 1hour   How long can you walk comfortably? 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 Yes   Pain Score 4    Pain Location Back   Pain Orientation Left   Pain Descriptors / Indicators Aching   Pain Type Chronic pain   Pain Onset More than a month ago  6 months or more   Pain Frequency Intermittent   Aggravating Factors  sitting,  driving better than before suregeryy   Pain Relieving Factors medicine , lie down          [facility] PT Assessment - 03/18/14 1345    Assessment   Medical Diagnosis Lumbar radiculopathy  post HNP surgery    Onset Date 05/16/13   Prior Therapy yes   Here at [facility]   Precautions   Precautions --   Precaution Comments post laminectomy 02-20-2015   Balance Screen   Has the patient fallen in the past 6 months No   Has the patient had a decrease in activity level because of a fear of falling?  No   Is the patient reluctant to leave their home because of a fear of falling?  No   Home Environment   Living Enviornment Private residence   Living Arrangements Spouse/significant other   Home Access Stairs to enter   Entrance Stairs-Number of Steps 13   [place] Two level   Prior Function   Level of Independence Independent with basic ADLs;Independent with homemaking with ambulation    Posture/Postural Control   Posture/Postural Control Postural limitations   Postural Limitations Decreased lumbar lordosis   AROM   Overall AROM  Within functional limits for tasks performed   Right Hip Flexion 110  Pt with pain on Right hip with SLR   Right Hip External Rotation  45   Right Hip Internal Rotation  35   Left Hip External Rotation  45   Left Hip Internal Rotation  15   Lumbar Flexion 60   Lumbar Extension 15   Lumbar - Right Side Bend 23   Lumbar - Left Side Bend 20   Lumbar - Right Rotation WFL   Lumbar - Left Rotation [facility]   Flexibility   Soft Tissue Assessment /Muscle Lenght --  hip flexor tightness   Hamstrings   60 bil      Therapeutic Exercise  Supine   Pelvic tilt  10 x,  instrtucted on transversus abdominis contraction  10 sec x 10 Supine Bent knee /Scissors  10 x Supine knee abd/ with abdominal contraction 10 R and L  Give posture and body mechanics for home work to review before instruction next visit                      PT Short Term Goals - 03/18/14 1847    PT SHORT TERM GOAL #1   Title Pt will be independent with initial HEP   Time 6   Period Weeks   Status New   PT SHORT TERM GOAL #2   Title Reduce pain from 5/10 to 3/10   Time 6   Period Weeks   Status New           PT Long Term Goals - 03/18/14 1415    PT LONG TERM GOAL #1   Title "Demonstrate and verbalize techniques to reduce the risk of re-injury including: lifting, posture, body mechanics.    Time 6   Period Weeks   Status New   PT LONG TERM GOAL #2   Title "Pt will tolerate sitting 1 hour without increased pain to ride in car without increased pain   Time 6   Period Weeks   Status New   PT LONG TERM GOAL #3   Title Pt will improve FOTO limitationfrom  57%  to 46% indicating improved functional mobility   Time 6   Period Weeks   Status New   PT LONG  TERM GOAL #4   Title "Pt will be independent with advanced HEP   Time 6   Period Weeks   Status New                Plan - 03/18/14 1412    Clinical Impression Statement Pt post surgery and needs abdominal strength/core post L3 l4 laminectomy 02-20-15.  Pt pain has improved from surgery from 9/10 to 4/10.  Pt would like to be able to sit longer than 10 minutes. Pt would benifit from skilled PT for core strengthening and education in Posture and body mechanics post surgery   Pt will benefit from skilled therapeutic intervention in order to improve on the following deficits Abnormal gait;Pain;Postural dysfunction;Decreased range of motion;Decreased activity tolerance;Decreased endurance;Decreased scar mobility;Decreased strength;Impaired flexibility;Improper body mechanics   Rehab Potential Good   PT Frequency 2x / week   PT Duration 6 weeks   PT Treatment/Interventions ADLs/Self Care Home Management;Cryotherapy;Electrical Stimulation;Moist Heat;Ultrasound;Functional mobility training;Neuromuscular re-education;Balance training;Therapeutic exercise;Patient/family education;Therapeutic activities;Manual techniques;Passive range of motion;Scar mobilization   PT Next Visit Plan Core Strength/ Basic Back for post surgery HNP  Laminectomy 02-20-2015  Go over posture/Body mechanics Handout given for home work and  supine Core exrer         Problem List Patient Active Problem List   Diagnosis Date Noted  . Lumbar spondylosis 02/19/2014  . Health care maintenance 11/07/2013  . Back pain 01/23/2013  . HYPERLIPIDEMIA 03/03/2010  . Anemia, iron deficiency 03/03/2010  . DEPRESSION 03/03/2010  . Allergic rhinitis 03/03/2010  . OSTEOPENIA 03/03/2010  . Insomnia, persistent 03/03/2010    Dorothea Ogle 03/18/2014, [facility] [address] [place], [place], [address] Phone: [phone]   Fax:  [phone]

## 2014-03-18 NOTE — Patient Instructions (Signed)
http://orth.exer.us/136   Copyright  VHI. All rights reserved.    PELVIC TILT  Lie on back, legs bent. Exhale, tilting top of pelvis back, pubic bone up, to flatten lower back. Inhale, rolling pelvis opposite way, top forward, pubic bone down, arch in back. Repeat __10__ times. Do __2__ sessions per day. Copyright  VHI. All rights reserved.    Isometric Hold With Pelvic Floor (Hook-Lying)  Lie with hips and knees bent. Slowly inhale, and then exhale. Pull navel toward spine and tighten pelvic floor. Hold for __10_ seconds. Continue to breathe in and out during hold. Rest for _10__ seconds. Repeat __10_ times. Do __2-3_ times a day.   Knee Fold  Lie on back, legs bent, arms by sides. Exhale, lifting knee to chest. Inhale, returning. Keep abdominals flat, navel to spine. Repeat __10__ times, alternating legs. Do __2__ sessions per day.  Knee Drop  Keep pelvis stable. Without rotating hips, slowly drop knee to side, pause, return to center, bring knee across midline toward opposite hip. Feel obliques engaging. Repeat for ___10_ times each leg.   Copyright  VHI. All rights reserved.  Sleeping on Back  Place pillow under knees. A pillow with cervical support and a roll around waist are also helpful. Copyright  VHI. All rights reserved.  Sleeping on Side Place pillow between knees. Use cervical support under neck and a roll around waist as needed. Copyright  VHI. All rights reserved.   Sleeping on Stomach   If this is the only desirable sleeping position, place pillow under lower legs, and under stomach or chest as needed.  Posture - Sitting   Sit upright, head facing forward. Try using a roll to support lower back. Keep shoulders relaxed, and avoid rounded back. Keep hips level with knees. Avoid crossing legs for long periods. Stand to Sit / Sit to Stand   To sit: Bend knees to lower self onto front edge of chair, then scoot back on seat. To stand: Reverse sequence by  placing one foot forward, and scoot to front of seat. Use rocking motion to stand up.   Work Height and Reach  Ideal work height is no more than 2 to 4 inches below elbow level when standing, and at elbow level when sitting. Reaching should be limited to arm's length, with elbows slightly bent.  Bending  Bend at hips and knees, not back. Keep feet shoulder-width apart.    Posture - Standing   Good posture is important. Avoid slouching and forward head thrust. Maintain curve in low back and align ears over shoul- ders, hips over ankles.  Alternating Positions   Alternate tasks and change positions frequently to reduce fatigue and muscle tension. Take rest breaks. Computer Work   Position work to Programmer, multimedia. Use proper work and seat height. Keep shoulders back and down, wrists straight, and elbows at right angles. Use chair that provides full back support. Add footrest and lumbar roll as needed.  Getting Into / Out of Car  Lower self onto seat, scoot back, then bring in one leg at a time. Reverse sequence to get out.  Dressing  Lie on back to pull socks or slacks over feet, or sit and bend leg while keeping back straight.    Housework - Sink  Place one foot on ledge of cabinet under sink when standing at sink for prolonged periods.   Pushing / Pulling  Pushing is preferable to pulling. Keep back in proper alignment, and use leg muscles to do  the work.  Deep Squat   Squat and lift with both arms held against upper trunk. Tighten stomach muscles without holding breath. Use smooth movements to avoid jerking.  Avoid Twisting   Avoid twisting or bending back. Pivot around using foot movements, and bend at knees if needed when reaching for articles.  Carrying Luggage   Distribute weight evenly on both sides. Use a cart whenever possible. Do not twist trunk. Move body as a unit.   Lifting Principles .Maintain proper posture and head alignment. .Slide object as  close as possible before lifting. .Move obstacles out of the way. .Test before lifting; ask for help if too heavy. .Tighten stomach muscles without holding breath. .Use smooth movements; do not jerk. .Use legs to do the work, and pivot with feet. .Distribute the work load symmetrically and close to the center of trunk. .Push instead of pull whenever possible.   Ask For Help   Ask for help and delegate to others when possible. Coordinate your movements when lifting together, and maintain the low back curve.  Log Roll   Lying on back, bend left knee and place left arm across chest. Roll all in one movement to the right. Reverse to roll to the left. Always move as one unit. Housework - Sweeping  Use long-handled equipment to avoid stooping.   Housework - Wiping  Position yourself as close as possible to reach work surface. Avoid straining your back.  Laundry - Unloading Wash   To unload small items at bottom of washer, lift leg opposite to arm being used to reach.  Reform close to area to be raked. Use arm movements to do the work. Keep back straight and avoid twisting.     Cart  When reaching into cart with one arm, lift opposite leg to keep back straight.   Getting Into / Out of Bed  Lower self to lie down on one side by raising legs and lowering head at the same time. Use arms to assist moving without twisting. Bend both knees to roll onto back if desired. To sit up, start from lying on side, and use same move-ments in reverse. Housework - Vacuuming  Hold the vacuum with arm held at side. Step back and forth to move it, keeping head up. Avoid twisting.   Laundry - IT consultant so that bending and twisting can be avoided.   Laundry - Unloading Dryer  Squat down to reach into clothes dryer or use a reacher.  Gardening - Weeding / Probation officer or Kneel. Knee pads may be helpful.                    Remember to sit on your sit bones. Evenly on seat.

## 2014-03-21 ENCOUNTER — Ambulatory Visit: Payer: 59 | Admitting: Physical Therapy

## 2014-03-21 DIAGNOSIS — Z5189 Encounter for other specified aftercare: Secondary | ICD-10-CM | POA: Diagnosis not present

## 2014-03-21 DIAGNOSIS — M5416 Radiculopathy, lumbar region: Secondary | ICD-10-CM

## 2014-03-21 DIAGNOSIS — Z9889 Other specified postprocedural states: Secondary | ICD-10-CM

## 2014-03-21 NOTE — Therapy (Signed)
[place], [place], [address] Phone: [phone]   Fax:  [phone]  Physical Therapy Treatment  Patient Details  Name: Sharon Barrett MRN: [id number] Date of Birth: Jul 28, 1950 Referring Provider:  Bartholomew Crews, MD  Encounter Date: 03/21/2014      PT End of Session - 03/21/14 1243    Visit Number 2   Number of Visits 12   Date for PT Re-Evaluation 04/29/14   PT Start Time 1100   PT Stop Time 1145   PT Time Calculation (min) 45 min   Equipment Utilized During Treatment --  sheet for use with Hamstring stretch supine   Activity Tolerance Patient tolerated treatment well   Behavior During Therapy [facility] for tasks assessed/performed      Past Medical History  Diagnosis Date  . Muscle spasm     takes Flexeril daily as needed  . Constipation     takes Colace daily  . Depression     takes Effexor daily  . Seasonal allergies     takes Claritin daily and uses Flonase daily as needed  . PONV (postoperative nausea and vomiting)   . Spinal headache 1979    had a spinal tap;no blood patch required  . Sinus headache     occasionally  . Arthritis     right hand  . Chronic back pain     spondylosis/radiculopathy/stenosis.  Marland Kitchen History of colon polyps     benign  . Anemia     takes Ferrous Gluconate daily  . History of blood transfusion     no abnormal reaction noted  . Cataracts, bilateral     immature   . Anxiety     takes Klonopin daily as needed  . History of shingles     Past Surgical History  Procedure Laterality Date  . Spine surgery  [id number]  . Anal fissure repair  0626    with complicated peri-rectal abscess  . Colposcopy  in 1980's    for abnormal PAP   . Bladder stricture   1966    repaired  . Tonsillectomy  1955  . Tubal ligation  1981  . Colonoscopy    . Esophagogastroduodenoscopy    . Lumbar laminectomy/decompression microdiscectomy Left 02/19/2014    Procedure: LEFT  Lumbar three-four LAMINECTOMY/FORAMINOTOMY;  Surgeon: Kristeen Miss, MD;  Location: [place] [facility];  Service: Neurosurgery;  Laterality: Left;    There were no vitals taken for this visit.  Visit Diagnosis:  S/P lumbar laminectomy  Lumbar radiculopathy, chronic      Subjective Assessment - 03/21/14 1110    Symptoms 3/10 now.  but I overdid thngs on Tuesday so I was a 5/10 when she was sitting with back straight for 30 minutes.   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 Yes   Pain Score 3    Pain Location Back   Pain Orientation Left   Pain Descriptors / Indicators Aching   Pain Type Chronic pain   Pain Onset More than a month ago                    [facility] Adult PT Treatment/Exercise - 03/21/14 0001    Lumbar Exercises: Stretches   Passive Hamstring Stretch 3 reps;30 seconds   Passive Hamstring Stretch Limitations limitd by pain on left hip  Single Knee to Chest Stretch 2 reps;10 seconds  vc   Lower Trunk Rotation 3 reps;30 seconds  30 to 60 sec VC for gentle stretch   Pelvic Tilt --  10 reps x5   Lumbar Exercises: Supine   Ab Set 15 reps  vc/TC    Clam 10 reps  x2 VC   Bent Knee Raise 10 reps  x2 with VC   Bridge 10 reps   x2  VC   Other Supine Lumbar Exercises pelvic tilt  2 x 10  VC                  PT Short Term Goals - 03/21/14 1300    PT SHORT TERM GOAL #1   Title Pt will be independent with initial HEP   Time 6   Period Weeks   Status On-going   PT SHORT TERM GOAL #2   Title Reduce pain from 5/10 to 3/10   Time 6   Period Weeks   Status On-going           PT Long Term Goals - 03/21/14 1301    PT LONG TERM GOAL #1   Title "Demonstrate and verbalize techniques to reduce the risk of re-injury including: lifting, posture, body mechanics.    Time 6   Period Weeks   Status On-going   PT LONG TERM  GOAL #2   Title "Pt will tolerate sitting 1 hour without increased pain to ride in car without increased pain   Time 6   Period Weeks   Status On-going   PT LONG TERM GOAL #3   Title Pt will improve FOTO limitationfrom  57%  to 46% indicating improved functional mobility   Time 6   Period Weeks   Status On-going   PT LONG TERM GOAL #4   Title "Pt will be independent with advanced HEP   Time 6   Period Weeks   Status On-going               Plan - 03/21/14 1146    Clinical Impression Statement Pt post L3-L4 laminectomy surgery 02-20-15.  Pt at 3/10 pain level and at 2.5/10 level after exercise session.  Pt compliant with exercise.  Continue progressive exercise   Pt will benefit from skilled therapeutic intervention in order to improve on the following deficits Abnormal gait;Pain;Postural dysfunction;Decreased range of motion;Decreased activity tolerance;Decreased endurance;Decreased scar mobility;Decreased strength;Impaired flexibility;Improper body mechanics   PT Frequency 2x / week   PT Duration 6 weeks   PT Treatment/Interventions ADLs/Self Care Home Management;Cryotherapy;Electrical Stimulation;Moist Heat;Ultrasound;Functional mobility training;Neuromuscular re-education;Balance training;Therapeutic exercise;Patient/family education;Therapeutic activities;Manual techniques;Passive range of motion;Scar mobilization   PT Next Visit Plan Core Strength/ Basic Back for post surgery HNP  Laminectomy 02-20-2015  Go over posture/Body mechanics Handout given for basic back in supine.  Add gastroc/soleus stretch        Problem List Patient Active Problem List   Diagnosis Date Noted  . Lumbar spondylosis 02/19/2014  . Health care maintenance 11/07/2013  . Back pain 01/23/2013  . HYPERLIPIDEMIA 03/03/2010  . Anemia, iron deficiency 03/03/2010  . DEPRESSION 03/03/2010  . Allergic rhinitis 03/03/2010  . OSTEOPENIA 03/03/2010  . Insomnia, persistent 03/03/2010   Voncille Lo, PT 03/21/2014 1:16 PM Phone: [phone] Fax: [facility] [address] [place], [place], [address] Phone: [phone]   Fax:  [phone]

## 2014-03-21 NOTE — Patient Instructions (Addendum)
Bridge   Lie back, legs bent. Inhale, pressing hips up. Keeping ribs in, lengthen lower back. Exhale, rolling down along spine from top. Repeat _3 x 10___ times. Do _1___ sessions per day. Hold for 3 seconds        Copyright  VHI. All rights reserved. Knee to Chest (Flexion)   Pull knee toward chest. Feel stretch in lower back or buttock area. Breathing deeply, Hold _3___ seconds. Repeat with other knee. Repeat _10___ times. Do _1___ sessions per day.  http://gt2.exer.us/225   Copyright  VHI. All rights reserved.   Lower Trunk Rotation Stretch   Keeping back flat and feet together, rotate knees to left side. Hold __30__ seconds. Repeat __2__ times per set. Do _1___ sets per session. Do _1___ sessions per day.  http://orth.exer.us/122   Copyright  VHI. All rights reserved.  Supine: Leg Stretch With Strap (Basic)   Lie on back with one knee bent, foot flat on floor. Hook strap around other foot. Straighten knee. Keep knee level with other knee. Hold 30___ seconds. Relax leg completely down to floor.  Repeat _2-3__ times per session. Do _1__ sessions per day.  To stretch IT Band.  Bring Left great toe over in line with R shoulder and hold 30 sec  2- 3 times per session. Do 1 session today.  Voncille Lo, PT 03/21/2014 11:44 AM Phone: [phone] Fax: [phone]

## 2014-03-26 ENCOUNTER — Ambulatory Visit: Payer: 59 | Admitting: Physical Therapy

## 2014-03-26 DIAGNOSIS — M5416 Radiculopathy, lumbar region: Secondary | ICD-10-CM

## 2014-03-26 DIAGNOSIS — Z9889 Other specified postprocedural states: Secondary | ICD-10-CM

## 2014-03-26 DIAGNOSIS — Z5189 Encounter for other specified aftercare: Secondary | ICD-10-CM | POA: Diagnosis not present

## 2014-03-26 NOTE — Patient Instructions (Signed)

## 2014-03-26 NOTE — Therapy (Signed)
[place], [place], [address] Phone: [phone]   Fax:  [phone]  Physical Therapy Treatment  Patient Details  Name: Sharon Barrett MRN: [id number] Date of Birth: 1950/08/10 Referring Provider:  Bartholomew Crews, MD  Encounter Date: 03/26/2014      PT End of Session - 03/26/14 1628    Visit Number 3   Number of Visits 12   Date for PT Re-Evaluation 04/29/14   PT Start Time 6381   PT Stop Time 7711   PT Time Calculation (min) 59 min   Activity Tolerance Patient tolerated treatment well;Patient limited by pain      Past Medical History  Diagnosis Date  . Muscle spasm     takes Flexeril daily as needed  . Constipation     takes Colace daily  . Depression     takes Effexor daily  . Seasonal allergies     takes Claritin daily and uses Flonase daily as needed  . PONV (postoperative nausea and vomiting)   . Spinal headache 1979    had a spinal tap;no blood patch required  . Sinus headache     occasionally  . Arthritis     right hand  . Chronic back pain     spondylosis/radiculopathy/stenosis.  Marland Kitchen History of colon polyps     benign  . Anemia     takes Ferrous Gluconate daily  . History of blood transfusion     no abnormal reaction noted  . Cataracts, bilateral     immature   . Anxiety     takes Klonopin daily as needed  . History of shingles     Past Surgical History  Procedure Laterality Date  . Spine surgery  [id number]  . Anal fissure repair  6579    with complicated peri-rectal abscess  . Colposcopy  in 1980's    for abnormal PAP   . Bladder stricture   1966    repaired  . Tonsillectomy  1955  . Tubal ligation  1981  . Colonoscopy    . Esophagogastroduodenoscopy    . Lumbar laminectomy/decompression microdiscectomy Left 02/19/2014    Procedure: LEFT Lumbar three-four LAMINECTOMY/FORAMINOTOMY;  Surgeon: Kristeen Miss, MD;  Location: [facility];  Service: Neurosurgery;   Laterality: Left;    There were no vitals taken for this visit.  Visit Diagnosis:  S/P lumbar laminectomy  Lumbar radiculopathy, chronic      Subjective Assessment - 03/26/14 1552    Symptoms 3/10 now   Pain Score 3    Pain Orientation Left   Pain Descriptors / Indicators Aching   Pain Type Chronic pain   Pain Onset More than a month ago   Pain Frequency Constant   Aggravating Factors  doing too , pain goes down to knee, leg pain intermittant , not in hooklying   Pain Relieving Factors medication lie down   Multiple Pain Sites No                    [facility] Adult PT Treatment/Exercise - 03/26/14 1600    Lumbar Exercises: Stretches   Passive Hamstring Stretch 3 reps   Single Knee to Chest Stretch 3 reps   Lower Trunk Rotation 5 reps   Lumbar Exercises: Standing   Wall Slides 5 reps  multiple cues   Other Standing Lumbar Exercises hip hinges, reports knee pain Lt even with cues so stopped.   Lumbar Exercises: Supine   Ab Set 15  reps   Clam 10 reps   Bent Knee Raise 10 reps   Bridge 10 reps  painful to knee, even with modification. 4/10   Other Supine Lumbar Exercises 20 reps 3 second holds.   Knee/Hip Exercises: Stretches   Gastroc Stretch 3 reps;30 seconds  added to home exercise   Moist Heat Therapy   Number Minutes Moist Heat 15 Minutes   Moist Heat Location --  Lt hip and thigh                PT Education - 03/26/14 1624    Education provided Yes   Education Details ADL handout, gastroc stretch          PT Short Term Goals - 03/26/14 1630    PT SHORT TERM GOAL #1   Title Pt will be independent with initial HEP   Time 6   Period Weeks   Status Achieved   PT SHORT TERM GOAL #2   Title Reduce pain from 5/10 to 3/10   Time 6   Period Weeks   Status On-going           PT Long Term Goals - 03/21/14 1301    PT LONG TERM GOAL #1   Title "Demonstrate and verbalize techniques to reduce the risk of re-injury including: lifting,  posture, body mechanics.    Time 6   Period Weeks   Status On-going   PT LONG TERM GOAL #2   Title "Pt will tolerate sitting 1 hour without increased pain to ride in car without increased pain   Time 6   Period Weeks   Status On-going   PT LONG TERM GOAL #3   Title Pt will improve FOTO limitationfrom  57%  to 46% indicating improved functional mobility   Time 6   Period Weeks   Status On-going   PT LONG TERM GOAL #4   Title "Pt will be independent with advanced HEP   Time 6   Period Weeks   Status On-going               Plan - 03/26/14 1629    Clinical Impression Statement Focus today on back exercises care taken to use good techniques and not increase leg pain.  4.5/10 pain after exercise   PT Next Visit Plan Core Strength/ Basic Back for post surgery HNP  Laminectomy 02-20-2015  Go over posture/Body mechanics Handout given for basic back in supine.  Add gastroc/soleus stretch        Problem List Patient Active Problem List   Diagnosis Date Noted  . Lumbar spondylosis 02/19/2014  . Health care maintenance 11/07/2013  . Back pain 01/23/2013  . HYPERLIPIDEMIA 03/03/2010  . Anemia, iron deficiency 03/03/2010  . DEPRESSION 03/03/2010  . Allergic rhinitis 03/03/2010  . OSTEOPENIA 03/03/2010  . Insomnia, persistent 03/03/2010   Melvenia Needles, PTA 03/26/2014 4:31 PM Phone: [phone] Fax: [phone]  [facility] 03/26/2014, 4:31 PM  [place], [place], [address] Phone: [phone]   Fax:  [phone]

## 2014-03-28 ENCOUNTER — Ambulatory Visit: Payer: 59 | Admitting: Physical Therapy

## 2014-03-28 DIAGNOSIS — M5416 Radiculopathy, lumbar region: Secondary | ICD-10-CM

## 2014-03-28 DIAGNOSIS — Z5189 Encounter for other specified aftercare: Secondary | ICD-10-CM | POA: Diagnosis not present

## 2014-03-28 DIAGNOSIS — Z9889 Other specified postprocedural states: Secondary | ICD-10-CM

## 2014-03-28 NOTE — Patient Instructions (Signed)
  Bracing With Arm Lift (Prone) For these exercises pillow is optional  With pillow support, lie on abdomen. Find neutral spine. Tighten pelvic floor and abdominals and hold . Alternately raise arms off floor. Repeat 5-10__ times. Do _1__ times a day. 1  Copyright  VHI. All rights reserved.  Bracing With Leg Lift (Prone)   With pillow support, lie on abdomen and neutral spine, tighten pelvic floor and abdominals and hold. Alternately raise legs off floor. Repeat _5-10__ times. Do 1___ times a day.   Copyright  VHI. All rights reserved.   Bracing With Arm / Leg Lift (Prone)   With pillow support, lie on abdomen. Find neutral spine. Tighten pelvic floor and abdominals and hold. Alternately raise one arm and opposite leg. Repeat _5;10__ times. Do ___ times a day.   Copyright  VHI. All rights reserved.

## 2014-03-28 NOTE — Therapy (Signed)
[place], [place], [address] Phone: [phone]   Fax:  [phone]  Physical Therapy Treatment  Patient Details  Name: Sharon Barrett MRN: [id number] Date of Birth: 04/14/1950 Referring Provider:  Bartholomew Crews, MD  Encounter Date: 03/28/2014      PT End of Session - 03/28/14 1446    Visit Number 4   Number of Visits 12   Date for PT Re-Evaluation 04/29/14   PT Start Time 3295   PT Stop Time 1440   PT Time Calculation (min) 65 min   Activity Tolerance Patient tolerated treatment well;Patient limited by pain      Past Medical History  Diagnosis Date  . Muscle spasm     takes Flexeril daily as needed  . Constipation     takes Colace daily  . Depression     takes Effexor daily  . Seasonal allergies     takes Claritin daily and uses Flonase daily as needed  . PONV (postoperative nausea and vomiting)   . Spinal headache 1979    had a spinal tap;no blood patch required  . Sinus headache     occasionally  . Arthritis     right hand  . Chronic back pain     spondylosis/radiculopathy/stenosis.  Marland Kitchen History of colon polyps     benign  . Anemia     takes Ferrous Gluconate daily  . History of blood transfusion     no abnormal reaction noted  . Cataracts, bilateral     immature   . Anxiety     takes Klonopin daily as needed  . History of shingles     Past Surgical History  Procedure Laterality Date  . Spine surgery  [id number]  . Anal fissure repair  1884    with complicated peri-rectal abscess  . Colposcopy  in 1980's    for abnormal PAP   . Bladder stricture   1966    repaired  . Tonsillectomy  1955  . Tubal ligation  1981  . Colonoscopy    . Esophagogastroduodenoscopy    . Lumbar laminectomy/decompression microdiscectomy Left 02/19/2014    Procedure: LEFT Lumbar three-four LAMINECTOMY/FORAMINOTOMY;  Surgeon: Kristeen Miss, MD;  Location: [facility];  Service: Neurosurgery;   Laterality: Left;    There were no vitals taken for this visit.  Visit Diagnosis:  S/P lumbar laminectomy  Lumbar radiculopathy, chronic      Subjective Assessment - 03/28/14 1341    Symptoms Strong 3/10 with leg pain to knee.  Hip hinge last visit increased leg pain . it is still irritated.   Currently in Pain? Yes   Pain Score 3    Pain Location Back   Pain Orientation Left   Pain Descriptors / Indicators --  leg pain.   Pain Radiating Towards Lt knee   Aggravating Factors  Hiphinge.   Pain Relieving Factors hooklying, medication   Multiple Pain Sites No                    [facility] Adult PT Treatment/Exercise - 03/28/14 1350    Lumbar Exercises: Stretches   Passive Hamstring Stretch 3 reps;30 seconds   Lower Trunk Rotation 3 reps  2 seconds hold   Lumbar Exercises: Supine   Ab Set 10 reps;5 seconds   Clam 10 reps   Bent Knee Raise 10 reps   Bridge 10 reps;1 second   Lumbar Exercises: Prone   Single Arm Raise 5  reps  2 sets   Straight Leg Raise 5 reps  2 sets   Opposite Arm/Leg Raise 5 reps  Difficult   Knee/Hip Exercises: Stretches   Gastroc Stretch 3 reps;20 seconds  Feels stretch more in the LT   Moist Heat Therapy   Number Minutes Moist Heat 15 Minutes   Moist Heat Location --  back in supine   Electrical Stimulation   Electrical Stimulation Location back gluteal   Electrical Stimulation Action IFC   Electrical Stimulation Parameters 5   Electrical Stimulation Goals Pain                PT Education - 03/28/14 1445    Education provided Yes   Education Details prone strengthening   Person(s) Educated Patient   Methods Explanation;Demonstration;Handout   Comprehension Verbalized understanding;Returned demonstration          PT Short Term Goals - 03/26/14 1630    PT SHORT TERM GOAL #1   Title Pt will be independent with initial HEP   Time 6   Period Weeks   Status Achieved   PT SHORT TERM GOAL #2   Title Reduce pain from  5/10 to 3/10   Time 6   Period Weeks   Status On-going           PT Long Term Goals - 03/21/14 1301    PT LONG TERM GOAL #1   Title "Demonstrate and verbalize techniques to reduce the risk of re-injury including: lifting, posture, body mechanics.    Time 6   Period Weeks   Status On-going   PT LONG TERM GOAL #2   Title "Pt will tolerate sitting 1 hour without increased pain to ride in car without increased pain   Time 6   Period Weeks   Status On-going   PT LONG TERM GOAL #3   Title Pt will improve FOTO limitationfrom  57%  to 46% indicating improved functional mobility   Time 6   Period Weeks   Status On-going   PT LONG TERM GOAL #4   Title "Pt will be independent with advanced HEP   Time 6   Period Weeks   Status On-going               Plan - 03/28/14 1447    Clinical Impression Statement able to build strengthening home exercises.  no pain flare today, feels slightly better prior to modalities.   PT Home Exercise Plan review prone exercises        Problem List Patient Active Problem List   Diagnosis Date Noted  . Lumbar spondylosis 02/19/2014  . Health care maintenance 11/07/2013  . Back pain 01/23/2013  . HYPERLIPIDEMIA 03/03/2010  . Anemia, iron deficiency 03/03/2010  . DEPRESSION 03/03/2010  . Allergic rhinitis 03/03/2010  . OSTEOPENIA 03/03/2010  . Insomnia, persistent 03/03/2010   Melvenia Needles, PTA 03/28/2014 2:49 PM Phone: [phone] Fax: [phone]  [facility] 03/28/2014, 2:49 PM  [place], [place], [address] Phone: [phone]   Fax:  [phone]

## 2014-04-01 ENCOUNTER — Telehealth: Payer: Self-pay | Admitting: *Deleted

## 2014-04-01 ENCOUNTER — Ambulatory Visit: Payer: 59 | Admitting: Physical Therapy

## 2014-04-01 DIAGNOSIS — Z5189 Encounter for other specified aftercare: Secondary | ICD-10-CM | POA: Diagnosis not present

## 2014-04-01 DIAGNOSIS — M5416 Radiculopathy, lumbar region: Secondary | ICD-10-CM

## 2014-04-01 DIAGNOSIS — Z9889 Other specified postprocedural states: Secondary | ICD-10-CM

## 2014-04-01 NOTE — Telephone Encounter (Signed)
appts made and printed...td 

## 2014-04-01 NOTE — Patient Instructions (Signed)
Combination (Quadruped)  On hands and knees with towel roll between knees, slowly inhale, and then exhale. Pull navel toward spine, squeeze roll with knees, and tighten pelvic floor. Hold for __10 seconds. Rest for _5__ seconds. Repeat _10__ times. Do __1_ times a day.  Bracing With Arm Raise (Quadruped)  On hands and knees find neutral spine. Tighten pelvic floor and abdominals and hold. Alternately lift arm to shoulder level. Repeat _10__ times. Do _1__ times a day. Hold for 3 seconds each.  Quadruped Alternate Hip Extension   Shift weight to one side and raise opposite leg. Keep trunk steady. __10_ reps per set, __1-2_ sets per day, __6_ days per week Repeat with other leg. Hold for 3 seconds.  Bracing With Arm / Leg Raise (Quadruped)  On hands and knees find neutral spine. Tighten pelvic floor and abdominals and hold. (Keep neutral spine) Alternating, lift arm to shoulder level and opposite leg to hip level. Repeat __10_ times. Do _1-2__ times a day. Hold 3 seconds for good motor control.

## 2014-04-01 NOTE — Therapy (Signed)
[place], [place], [address] Phone: [phone]   Fax:  [phone]  Physical Therapy Treatment  Patient Details  Name: Sharon Barrett MRN: [id number] Date of Birth: 10/26/1950 Referring Provider:  Bartholomew Crews, MD  Encounter Date: 04/01/2014      PT End of Session - 04/01/14 1417    Visit Number 5   Number of Visits 12   Date for PT Re-Evaluation 04/29/14   PT Start Time 1330   PT Stop Time 1419   PT Time Calculation (min) 49 min   Equipment Utilized During Treatment --  ther Ex ball   Activity Tolerance Patient tolerated treatment well;Patient limited by pain   Behavior During Therapy [facility] for tasks assessed/performed      Past Medical History  Diagnosis Date  . Muscle spasm     takes Flexeril daily as needed  . Constipation     takes Colace daily  . Depression     takes Effexor daily  . Seasonal allergies     takes Claritin daily and uses Flonase daily as needed  . PONV (postoperative nausea and vomiting)   . Spinal headache 1979    had a spinal tap;no blood patch required  . Sinus headache     occasionally  . Arthritis     right hand  . Chronic back pain     spondylosis/radiculopathy/stenosis.  Marland Kitchen History of colon polyps     benign  . Anemia     takes Ferrous Gluconate daily  . History of blood transfusion     no abnormal reaction noted  . Cataracts, bilateral     immature   . Anxiety     takes Klonopin daily as needed  . History of shingles     Past Surgical History  Procedure Laterality Date  . Spine surgery  [id number]  . Anal fissure repair  6067    with complicated peri-rectal abscess  . Colposcopy  in 1980's    for abnormal PAP   . Bladder stricture   1966    repaired  . Tonsillectomy  1955  . Tubal ligation  1981  . Colonoscopy    . Esophagogastroduodenoscopy    . Lumbar laminectomy/decompression microdiscectomy Left 02/19/2014    Procedure: LEFT Lumbar  three-four LAMINECTOMY/FORAMINOTOMY;  Surgeon: Kristeen Miss, MD;  Location: [place];  Service: Neurosurgery;  Laterality: Left;    There were no vitals taken for this visit.  Visit Diagnosis:  S/P lumbar laminectomy  Lumbar radiculopathy, chronic      Subjective Assessment - 04/01/14 1334    Symptoms lessening 3/10 than last time with pain to the knee,  last time wall squat irritated initially   Limitations Sitting;Standing;Walking   How long can you sit comfortably? 1 to 2 hours   How long can you stand comfortably? 1 hour   How long can you walk comfortably? 30 minutes.  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  no longer going to foot   Pain Onset More than a month ago   Pain Frequency Constant   Aggravating Factors  hip hinge  [facility] Adult PT Treatment/Exercise - 04/01/14 1340    Lumbar Exercises: Stretches   Passive Hamstring Stretch --   Lower Trunk Rotation --   Lumbar Exercises: Machines for Strengthening   Leg Press 80 lb 12 reps x 3   Other Lumbar Machine Exercise Nustep level 3 4 minutes   Lumbar Exercises: Standing   Wall Slides 10 reps;3 seconds  using ball at mid back with VC   Lumbar Exercises: Supine   Ab Set 10 reps;5 seconds   Clam 10 reps   Bent Knee Raise 10 reps   Bridge 10 reps;1 second   Lumbar Exercises: Prone   Single Arm Raise 5 reps  2 sets   Straight Leg Raise 5 reps  2 sets   Opposite Arm/Leg Raise 5 reps  Difficult   Lumbar Exercises: Quadruped   Single Arm Raise 10 reps;3 seconds  bil   Straight Leg Raise 10 reps;3 seconds  bil with VC for neutral spine   Opposite Arm/Leg Raise 10 reps;3 seconds  for neutral spine VC   Other Quadruped  Lumbar Exercises neutral spoine pelvic floor contraction 10 reps 3 sec hold   Knee/Hip Exercises: Stretches   Gastroc Stretch --   Moist Heat Therapy   Moist Heat Location --   Acupuncturist Location --   Printmaker Goals --                PT Education - 04/01/14 1455    Education provided Yes   Education Details quadriped Press photographer) Educated Patient   Methods Explanation;Demonstration;Verbal cues;Tactile cues;Handout   Comprehension Verbalized understanding;Returned demonstration;Need further instruction;Verbal cues required;Tactile cues required          PT Short Term Goals - 04/01/14 1415    PT SHORT TERM GOAL #1   Title Pt will be independent with initial HEP   Time 6   Period Weeks   Status Achieved   PT SHORT TERM GOAL #2   Title Reduce pain from 5/10 to 3/10   Time 6   Period Weeks   Status Achieved           PT Long Term Goals - 04/01/14 1415    PT LONG TERM GOAL #1   Title "Demonstrate and verbalize techniques to reduce the risk of re-injury including: lifting, posture, body mechanics.    Time 6   Period Weeks   Status Partially Met   PT LONG TERM GOAL #2   Title "Pt will tolerate sitting 1 hour without increased pain to ride in car without increased pain   Time 6   Period Weeks   Status Achieved   PT LONG TERM GOAL #3   Title Pt will improve FOTO limitationfrom  57%  to 46% indicating improved functional mobility   Time 6   Period Weeks   Status On-going   PT LONG TERM GOAL #4   Title "Pt will be independent with advanced HEP   Time 6   Period Weeks   Status On-going   PT LONG TERM GOAL #5   Title Return to work on February 8th without exacerbating pain for 4 hours in work daywithout exacerbating pain   Time 6   Period Weeks   Status --               Plan - 04/01/14 1412    Clinical Impression Statement Pt able to perform HEP, ready to progress, but with poorer  motor control  in quadriped.  Will continue prone lumbar exercises and progress as able with Quadriped strengthening.  All short term goals met fo.  Pt  LTG ongoing for posture and body mechanics and lifting.  Pt able to sit for 1 hour and LTG met.  Pt new LTG for returning to work and tolerating 4 hour work day without exacerbation of pain   Pt will benefit from skilled therapeutic intervention in order to improve on the following deficits Abnormal gait;Pain;Postural dysfunction;Decreased range of motion;Decreased activity tolerance;Decreased endurance;Decreased scar mobility;Decreased strength;Impaired flexibility;Improper body mechanics   Rehab Potential Good   PT Frequency 2x / week   PT Duration 6 weeks   PT Treatment/Interventions ADLs/Self Care Home Management;Cryotherapy;Electrical Stimulation;Moist Heat;Ultrasound;Functional mobility training;Neuromuscular re-education;Balance training;Therapeutic exercise;Patient/family education;Therapeutic activities;Manual techniques;Passive range of motion;Scar mobilization   PT Next Visit Plan Core strength progression.  has prone and quadriped HEP.  Continue progression and motor control in order to achieve good form with Quadriped exercises   PT Home Exercise Plan review prone exercises and quadriped exercise   Consulted and Agree with Plan of Care --        Problem List Patient Active Problem List   Diagnosis Date Noted  . Lumbar spondylosis 02/19/2014  . Health care maintenance 11/07/2013  . Back pain 01/23/2013  . HYPERLIPIDEMIA 03/03/2010  . Anemia, iron deficiency 03/03/2010  . DEPRESSION 03/03/2010  . Allergic rhinitis 03/03/2010  . OSTEOPENIA 03/03/2010  . Insomnia, persistent 03/03/2010    Voncille Lo, PT 04/01/2014 2:58 PM Phone: [phone] Fax: [facility] [address] [place], [place], [address] Phone: [phone]   Fax:  [phone]

## 2014-04-04 ENCOUNTER — Ambulatory Visit: Payer: 59 | Admitting: Physical Therapy

## 2014-04-04 DIAGNOSIS — Z5189 Encounter for other specified aftercare: Secondary | ICD-10-CM | POA: Diagnosis not present

## 2014-04-04 DIAGNOSIS — Z9889 Other specified postprocedural states: Secondary | ICD-10-CM

## 2014-04-04 DIAGNOSIS — M5416 Radiculopathy, lumbar region: Secondary | ICD-10-CM

## 2014-04-04 NOTE — Patient Instructions (Signed)
May use ice or heat to hip area, use soft tissue techniques to soften and decrease pain.

## 2014-04-04 NOTE — Therapy (Signed)
[place], [place], [address] Phone: [phone]   Fax:  [phone]  Physical Therapy Treatment  Patient Details  Name: Sharon Barrett MRN: [id number] Date of Birth: 1951-01-17 Referring Provider:  Bartholomew Crews, MD  Encounter Date: 04/04/2014      PT End of Session - 04/04/14 1430    Visit Number 6   Number of Visits 12   Date for PT Re-Evaluation 04/29/14   PT Start Time 1330   PT Stop Time 1415   PT Time Calculation (min) 45 min   Activity Tolerance Patient tolerated treatment well;Patient limited by pain;Patient limited by fatigue      Past Medical History  Diagnosis Date  . Muscle spasm     takes Flexeril daily as needed  . Constipation     takes Colace daily  . Depression     takes Effexor daily  . Seasonal allergies     takes Claritin daily and uses Flonase daily as needed  . PONV (postoperative nausea and vomiting)   . Spinal headache 1979    had a spinal tap;no blood patch required  . Sinus headache     occasionally  . Arthritis     right hand  . Chronic back pain     spondylosis/radiculopathy/stenosis.  Marland Kitchen History of colon polyps     benign  . Anemia     takes Ferrous Gluconate daily  . History of blood transfusion     no abnormal reaction noted  . Cataracts, bilateral     immature   . Anxiety     takes Klonopin daily as needed  . History of shingles     Past Surgical History  Procedure Laterality Date  . Spine surgery  [id number]  . Anal fissure repair  1771    with complicated peri-rectal abscess  . Colposcopy  in 1980's    for abnormal PAP   . Bladder stricture   1966    repaired  . Tonsillectomy  1955  . Tubal ligation  1981  . Colonoscopy    . Esophagogastroduodenoscopy    . Lumbar laminectomy/decompression microdiscectomy Left 02/19/2014    Procedure: LEFT Lumbar three-four LAMINECTOMY/FORAMINOTOMY;  Surgeon: Kristeen Miss, MD;  Location: [place] [facility];   Service: Neurosurgery;  Laterality: Left;    There were no vitals taken for this visit.  Visit Diagnosis:  S/P lumbar laminectomy  Lumbar radiculopathy, chronic      Subjective Assessment - 04/04/14 1345    Symptoms supine with pillows and sidelying with pillows decreases leg pain                    [facility] Adult PT Treatment/Exercise - 04/04/14 1335    Lumbar Exercises: Machines for Strengthening   Leg Press --  80 LBS 12 reps 3 sets   Other Lumbar Machine Exercise --  Nustep, level 5 5 minutes, 314 steps   Lumbar Exercises: Standing   Wall Slides --  With wall slides using yellow ball, 10 reps   Lumbar Exercises: Supine   Ab Set 10 reps   Clam --  10 reps   Bent Knee Raise --  10 reps, Sharp pain anterior/lateral better with soft tissue   Bridge --  10 reps   Lumbar Exercises: Prone   Single Arm Raise --  10 reps   Straight Leg Raise 10 reps   Opposite Arm/Leg Raise 10 reps   Lumbar Exercises: Quadruped   Single  Arm Raise 10 reps   Straight Leg Raise 10 reps   Opposite Arm/Leg Raise 10 reps   Knee/Hip Exercises: Stretches   Gastroc Stretch --  3 reps, 30 second holds each                  PT Short Term Goals - 04/01/14 1415    PT SHORT TERM GOAL #1   Title Pt will be independent with initial HEP   Time 6   Period Weeks   Status Achieved   PT SHORT TERM GOAL #2   Title Reduce pain from 5/10 to 3/10   Time 6   Period Weeks   Status Achieved           PT Long Term Goals - 04/04/14 1434    PT LONG TERM GOAL #1   Title "Demonstrate and verbalize techniques to reduce the risk of re-injury including: lifting, posture, body mechanics.    Time 6   Period Weeks   Status Partially Met   PT LONG TERM GOAL #2   Title "Pt will tolerate sitting 1 hour without increased pain to ride in car without increased pain   Time 6   Period Weeks   Status Achieved   PT LONG TERM GOAL #3   Title Pt will improve FOTO limitationfrom  57%  to 46%  indicating improved functional mobility   Time 6   Period Weeks   Status Unable to assess   PT LONG TERM GOAL #4   Time 6   Period Weeks   Status On-going               Plan - 04/04/14 1431    Clinical Impression Statement Hip pain may be soft tissue related.  Strengthening continued today as previous able to increase reps.   PT Next Visit Plan tape hip if still tender.  Continue quadriped and prone, try bridge clams   Consulted and Agree with Plan of Care Patient        Problem List Patient Active Problem List   Diagnosis Date Noted  . Lumbar spondylosis 02/19/2014  . Health care maintenance 11/07/2013  . Back pain 01/23/2013  . HYPERLIPIDEMIA 03/03/2010  . Anemia, iron deficiency 03/03/2010  . DEPRESSION 03/03/2010  . Allergic rhinitis 03/03/2010  . OSTEOPENIA 03/03/2010  . Insomnia, persistent 03/03/2010   Sharon Barrett, PTA 04/04/2014 2:38 PM Phone: [phone] Fax: [phone]  [facility] 04/04/2014, 2:38 PM  [facility] [address] [place], [place], [address] Phone: [phone]   Fax:  [phone]

## 2014-04-08 ENCOUNTER — Ambulatory Visit: Payer: 59 | Attending: Neurological Surgery | Admitting: Physical Therapy

## 2014-04-08 ENCOUNTER — Other Ambulatory Visit: Payer: Self-pay | Admitting: Internal Medicine

## 2014-04-08 DIAGNOSIS — M545 Low back pain: Secondary | ICD-10-CM | POA: Diagnosis not present

## 2014-04-08 DIAGNOSIS — M25652 Stiffness of left hip, not elsewhere classified: Secondary | ICD-10-CM | POA: Insufficient documentation

## 2014-04-08 DIAGNOSIS — M5416 Radiculopathy, lumbar region: Secondary | ICD-10-CM

## 2014-04-08 DIAGNOSIS — Z5189 Encounter for other specified aftercare: Secondary | ICD-10-CM | POA: Diagnosis not present

## 2014-04-08 DIAGNOSIS — M25552 Pain in left hip: Secondary | ICD-10-CM | POA: Diagnosis not present

## 2014-04-08 DIAGNOSIS — R293 Abnormal posture: Secondary | ICD-10-CM | POA: Insufficient documentation

## 2014-04-08 DIAGNOSIS — Z9889 Other specified postprocedural states: Secondary | ICD-10-CM

## 2014-04-08 NOTE — Telephone Encounter (Signed)
Called to pharm 

## 2014-04-08 NOTE — Patient Instructions (Signed)
PELVIC STABILIZATION: Basic Bridge   Exhaling, lift hips. With Red T band around thighs. LIft hips and hold and then abduct legs for 3 seconds.  Bring hips back together and elease hips back to floor. Repeat __10 x 3_ times. Do __1_ times per day.  Copyright  VHI. All rights reserved.   Leg Extension (Hamstring) .      Hamstring Step 3   Left leg in maximal straight leg raise, heel at maximal stretch, straighten knee further by tightening knee cap. Warning: Intense stretch. Stay within tolerance. Hold _60__ seconds. Relax knee cap only. May do on Right leg as well. Repeat 2-3___ times.  I T band stretch  Same as above but bring great Toe of Left leg over Right shoulder after placing in hamstring stretch as above.  2 -3 times for 60 seconds  1 -2 times a day.  At work you may do stretches as directed using desk and chair for lumbosacral stretch.   Also every hour get up and stand next to counter and gently extend back.and look towards ceiling.  Remember to stretch before strengthening exercises

## 2014-04-08 NOTE — Therapy (Signed)
[place], [place], [address] Phone: [phone]   Fax:  [phone]  Physical Therapy Treatment  Patient Details  Name: Sharon Barrett MRN: [id number] Date of Birth: [birth year] Referring Provider:  Bartholomew Crews, MD  Encounter Date: 04/08/2014      PT End of Session - 04/08/14 1228    Visit Number 7   Number of Visits 12   Date for PT Re-Evaluation 04/29/14   PT Start Time 1324   PT Stop Time 1231   PT Time Calculation (min) 46 min   Equipment Utilized During Treatment --  sheet for hamstring stretch   Activity Tolerance Patient tolerated treatment well;Patient limited by pain;Patient limited by fatigue   Behavior During Therapy [facility] for tasks assessed/performed      Past Medical History  Diagnosis Date  . Muscle spasm     takes Flexeril daily as needed  . Constipation     takes Colace daily  . Depression     takes Effexor daily  . Seasonal allergies     takes Claritin daily and uses Flonase daily as needed  . PONV (postoperative nausea and vomiting)   . Spinal headache 1979    had a spinal tap;no blood patch required  . Sinus headache     occasionally  . Arthritis     right hand  . Chronic back pain     spondylosis/radiculopathy/stenosis.  Marland Kitchen History of colon polyps     benign  . Anemia     takes Ferrous Gluconate daily  . History of blood transfusion     no abnormal reaction noted  . Cataracts, bilateral     immature   . Anxiety     takes Klonopin daily as needed  . History of shingles     Past Surgical History  Procedure Laterality Date  . Spine surgery  [id number]  . Anal fissure repair  4010    with complicated peri-rectal abscess  . Colposcopy  in 1980's    for abnormal PAP   . Bladder stricture   1966    repaired  . Tonsillectomy  1955  . Tubal ligation  1981  . Colonoscopy    . Esophagogastroduodenoscopy    . Lumbar laminectomy/decompression microdiscectomy  Left 02/19/2014    Procedure: LEFT Lumbar three-four LAMINECTOMY/FORAMINOTOMY;  Surgeon: Kristeen Miss, MD;  Location: [place] [facility];  Service: Neurosurgery;  Laterality: Left;    There were no vitals taken for this visit.  Visit Diagnosis:  No diagnosis found.      Subjective Assessment - 04/08/14 1153    Symptoms I had the left hip pain stabbing last visit with marching exercise in clinic last time but I do not have the same pain at home..  Now 2/10 pain in hip and knee, Pain did wake me up after last PT .   Limitations Sitting;Standing;Walking   How long can you sit comfortably?  1 to 2 hours   sat at dinner 1 1/2 hours   How long can you stand comfortably? 1 1/2 hour   How long can you walk comfortably? 30 minutes with a mile   Currently in Pain? Yes   Pain Score 2    Pain Location --  and Left hip   Pain Orientation Left   Pain Descriptors / Indicators Discomfort   Pain Type Chronic pain   Pain Radiating Towards to left , no longer going to foot   Pain Onset More than  a month ago   Pain Frequency Constant   Pain Relieving Factors mediciation  Aleve and Tylenol   Multiple Pain Sites No                    [facility] Adult PT Treatment/Exercise - 04/08/14 1201    Posture/Postural Control   Posture/Postural Control Postural limitations   Postural Limitations Rounded Shoulders;Anterior pelvic tilt   Posture Comments Pt utilizing lumbar pillow  Pt instructed in bed making and lifting for posture   Lumbar Exercises: Stretches   Passive Hamstring Stretch 60 seconds;2 reps  VC   Single Knee to Chest Stretch 5 reps  5 sec VC   Quadruped Mid Back Stretch 30 seconds;2 reps   Quadruped Mid Back Stretch Limitations none   ITB Stretch 60 seconds;2 reps  VC for supine stretches   Lumbar Exercises: Machines for Strengthening   Leg Press --  100 LBS 12 reps 3 sets   Other Lumbar Machine Exercise --  Nustep, level 5 4 minutes,   Lumbar Exercises: Standing   Wall Slides --    Lumbar Exercises: Supine   Ab Set --   Clam --   Bent Knee Raise --   Bridge 10 reps;3 seconds  with red t band clam hip abd with bridge 2 x 10   Lumbar Exercises: Prone   Single Arm Raise --   Straight Leg Raise 10 reps   Opposite Arm/Leg Raise 10 reps   Lumbar Exercises: Quadruped   Single Arm Raise 10 reps   Straight Leg Raise 10 reps   Opposite Arm/Leg Raise 10 reps   Knee/Hip Exercises: Stretches   Passive Hamstring Stretch 60 seconds;2 reps  left side VC for correct stretch   Gastroc Stretch --  3 reps, 30 second holds each                PT Education - 04/08/14 1227    Education provided Yes   Education Details Pt reviewed Posture for making beds and lifting with return demo and reviewed exercise and directed to only do exercises placed in Right side of home ex folder.  D/C exercises from first visit in October 2015   Person(s) Educated Patient   Methods Explanation;Demonstration;Tactile cues;Handout;Verbal cues   Comprehension Verbalized understanding;Returned demonstration;Tactile cues required;Verbal cues required          PT Short Term Goals - 04/08/14 1318    PT SHORT TERM GOAL #1   Title Pt will be independent with initial HEP   Time 6   Period Weeks   Status Achieved   PT SHORT TERM GOAL #2   Title Reduce pain from 5/10 to 3/10   Time 6   Period Weeks   Status Achieved           PT Long Term Goals - 04/08/14 1230    PT LONG TERM GOAL #1   Title "Demonstrate and verbalize techniques to reduce the risk of re-injury including: lifting, posture, body mechanics.    Time 6   Period Weeks   Status Achieved   PT LONG TERM GOAL #2   Title "Pt will tolerate sitting 1 hour without increased pain to ride in car without increased pain   Time 6   Status Achieved   PT LONG TERM GOAL #3   Title Pt will improve FOTO limitationfrom  57%  to 46% indicating improved functional mobility   Time 6   Period Weeks   Status Unable to assess  will assess on  10 th visit   PT LONG TERM GOAL #4   Title "Pt will be independent with advanced HEP   Time 6   Period Weeks   Status On-going   PT LONG TERM GOAL #5   Title Return to work on February 8th without exacerbating pain for 4 hours in work daywithout exacerbating pain   Time 6   Period Weeks   Status On-going   Additional Long Term Goals   Additional Long Term Goals Yes   PT LONG TERM GOAL #6   Title Pt will demonstrate strategies for relieveing pain at work with full day of work eventual   Time 6   Period Weeks   Status New               Plan - 04/08/14 1314    Clinical Impression Statement Pt with hip pain at end of last session and kept pt up treatment day night but pt issue has somewhat resolved.  Pt was instructed in supine hamstring stretch and IT band in supine which releived tightness .  Pt with tight IT band on left and pain resoved after stretching.  Wil Pt also had HEP from PT session pre surgery and PT went through and reviedwed exercises  and gave current instructions for abdominal and core strengthening. Pt  also given supine abdominal bridges with red t band   Pt will benefit from skilled therapeutic intervention in order to improve on the following deficits Abnormal gait;Pain;Postural dysfunction;Decreased range of motion;Decreased activity tolerance;Decreased endurance;Decreased scar mobility;Decreased strength;Impaired flexibility;Improper body mechanics   Rehab Potential Good   PT Frequency 2x / week   PT Duration 6 weeks   PT Treatment/Interventions ADLs/Self Care Home Management;Cryotherapy;Electrical Stimulation;Moist Heat;Ultrasound;Functional mobility training;Neuromuscular re-education;Balance training;Therapeutic exercise;Patient/family education;Therapeutic activities;Manual techniques;Passive range of motion;Scar mobilization   PT Next Visit Plan Continue quadriped and progressive abdominal strength.  Tape if hip pain is not resolved        Problem  List Patient Active Problem List   Diagnosis Date Noted  . Lumbar spondylosis 02/19/2014  . Health care maintenance 11/07/2013  . Back pain 01/23/2013  . HYPERLIPIDEMIA 03/03/2010  . Anemia, iron deficiency 03/03/2010  . DEPRESSION 03/03/2010  . Allergic rhinitis 03/03/2010  . OSTEOPENIA 03/03/2010  . Insomnia, persistent 03/03/2010   Voncille Lo, PT 04/08/2014 1:30 PM Phone: [phone] Fax: [facility] [address] [place], [place], [address] Phone: [phone]   Fax:  [phone]

## 2014-04-09 ENCOUNTER — Encounter: Payer: 59 | Admitting: Physical Therapy

## 2014-04-10 ENCOUNTER — Encounter: Payer: 59 | Admitting: Rehabilitation

## 2014-04-10 ENCOUNTER — Encounter: Payer: 59 | Admitting: Physical Therapy

## 2014-04-11 ENCOUNTER — Telehealth: Payer: Self-pay | Admitting: *Deleted

## 2014-04-11 ENCOUNTER — Ambulatory Visit: Payer: 59 | Admitting: Physical Therapy

## 2014-04-11 DIAGNOSIS — Z5189 Encounter for other specified aftercare: Secondary | ICD-10-CM | POA: Diagnosis not present

## 2014-04-11 DIAGNOSIS — Z9889 Other specified postprocedural states: Secondary | ICD-10-CM

## 2014-04-11 DIAGNOSIS — M5416 Radiculopathy, lumbar region: Secondary | ICD-10-CM

## 2014-04-11 NOTE — Patient Instructions (Signed)
Pt reviewed exercises with added weights 3 pounds  Pt given Home Exercise sheet for Total Motion arm raise, leg raise and trunk rotation to easier side 15 sec x 4 each and then repeat on more stiff side.  Pt given handout.

## 2014-04-11 NOTE — Therapy (Signed)
[place], [place], [address] Phone: [phone]   Fax:  [phone]  Physical Therapy Treatment  Patient Details  Name: Sharon Barrett MRN: [id number] Date of Birth: 1951/01/23 Referring Provider:  Bartholomew Crews, MD  Encounter Date: 04/11/2014      PT End of Session - 04/11/14 1455    Visit Number 8   Number of Visits 12   Date for PT Re-Evaluation 04/29/14   PT Start Time 1104   PT Stop Time 1146   PT Time Calculation (min) 42 min   Activity Tolerance Patient tolerated treatment well   Behavior During Therapy [facility] for tasks assessed/performed      Past Medical History  Diagnosis Date  . Muscle spasm     takes Flexeril daily as needed  . Constipation     takes Colace daily  . Depression     takes Effexor daily  . Seasonal allergies     takes Claritin daily and uses Flonase daily as needed  . PONV (postoperative nausea and vomiting)   . Spinal headache 1979    had a spinal tap;no blood patch required  . Sinus headache     occasionally  . Arthritis     right hand  . Chronic back pain     spondylosis/radiculopathy/stenosis.  Marland Kitchen History of colon polyps     benign  . Anemia     takes Ferrous Gluconate daily  . History of blood transfusion     no abnormal reaction noted  . Cataracts, bilateral     immature   . Anxiety     takes Klonopin daily as needed  . History of shingles     Past Surgical History  Procedure Laterality Date  . Spine surgery  [id number]  . Anal fissure repair  5038    with complicated peri-rectal abscess  . Colposcopy  in 1980's    for abnormal PAP   . Bladder stricture   1966    repaired  . Tonsillectomy  1955  . Tubal ligation  1981  . Colonoscopy    . Esophagogastroduodenoscopy    . Lumbar laminectomy/decompression microdiscectomy Left 02/19/2014    Procedure: LEFT Lumbar three-four LAMINECTOMY/FORAMINOTOMY;  Surgeon: Kristeen Miss, MD;  Location: [place]  [facility];  Service: Neurosurgery;  Laterality: Left;    There were no vitals taken for this visit.  Visit Diagnosis:  S/P lumbar laminectomy  Lumbar radiculopathy, chronic      Subjective Assessment - 04/11/14 1106    Symptoms Pt walking one mile and 1/2.  Pt with 1 -2/10 today and will return to work next week 4 hour days   Currently in Pain? Yes   Pain Score 2    Pain Location Back   Pain Orientation Left   Pain Descriptors / Indicators Discomfort   Pain Type Chronic pain   Pain Onset More than a month ago   Pain Frequency Intermittent                    [facility] Adult PT Treatment/Exercise - 04/11/14 0001    Posture/Postural Control   Posture/Postural Control Postural limitations   Postural Limitations Rounded Shoulders;Anterior pelvic tilt   Posture Comments Pt.instructed to make a bed by goint to each corner and using legs bent and pulling sheet over corner rather than on top of bed and extending back.  PT demo for pt  Pt instructed in bed making and lifting for posture  Lumbar Exercises: Stretches   Passive Hamstring Stretch 60 seconds;2 reps  VC   Single Knee to Chest Stretch 5 reps  5 sec VC   Quadruped Mid Back Stretch 30 seconds;2 reps   Quadruped Mid Back Stretch Limitations none   ITB Stretch 60 seconds;2 reps  VC for supine stretches   Lumbar Exercises: Machines for Strengthening   Leg Press 100 lbs 12 reps 3 sets  VC  100 LBS 12 reps 3 sets   Other Lumbar Machine Exercise --  Nustep, level 6 80minutes,   Lumbar Exercises: Seated   Other Seated Lumbar Exercises Total motion exercises for arm raise, leg raise and  trunk rotation to use at desk to return to work for pain/discomfort relief.   Lumbar Exercises: Supine   Ab Set 10 reps;3 seconds   Bridge 10 reps;3 seconds  with red t band clam hip abd with bridge 2 x 10   Lumbar Exercises: Prone   Straight Leg Raise 10 reps  3lb weight x2 with VC for AB set bilateraly   Opposite Arm/Leg Raise 10  reps   Lumbar Exercises: Quadruped   Single Arm Raise 10 reps  bilaterallly with 3 lb   Straight Leg Raise 10 reps  with 3 lb VC   Opposite Arm/Leg Raise 10 reps  with 3 lb wt bilaterally   Knee/Hip Exercises: Stretches   Passive Hamstring Stretch 60 seconds;2 reps  left side VC for correct stretch   Gastroc Stretch --  3 reps, 30 second holds each                PT Education - 04/11/14 1457    Education Details Pt re instructed on bed making and progressed exercises with added weight.  Pt given HEP for Total motion arm raise,leg raise and trunk rotation to use at desk when she returns to rwork next week   Person(s) Educated Patient   Methods Explanation;Demonstration;Verbal cues   Comprehension Verbalized understanding;Returned demonstration          PT Short Term Goals - 04/08/14 1318    PT SHORT TERM GOAL #1   Title Pt will be independent with initial HEP   Time 6   Period Weeks   Status Achieved   PT SHORT TERM GOAL #2   Title Reduce pain from 5/10 to 3/10   Time 6   Period Weeks   Status Achieved           PT Long Term Goals - 04/11/14 1500    PT LONG TERM GOAL #1   Title "Demonstrate and verbalize techniques to reduce the risk of re-injury including: lifting, posture, body mechanics.    PT LONG TERM GOAL #2   Title "Pt will tolerate sitting 1 hour without increased pain to ride in car without increased pain   Time 6   Period Weeks   Status Achieved   PT LONG TERM GOAL #3   Title Pt will improve FOTO limitationfrom  57%  to 46% indicating improved functional mobility   Time 6   Period Weeks   Status On-going   PT LONG TERM GOAL #4   Title "Pt will be independent with advanced HEP   Time 6   Period Weeks   Status On-going   PT LONG TERM GOAL #5   Title Return to work on February 8th without exacerbating pain for 4 hours in work daywithout exacerbating pain  Pt has been released to go to work   Time 6  Period Weeks   Status On-going   PT  LONG TERM GOAL #6   Title Pt will demonstrate strategies for relieveing pain at work with full day of work eventual   Time 6   Period Weeks   Status On-going               Plan - 04/11/14 1148    Clinical Impression Statement Pt saw Dr. Ellene Route and he has released her to return to work on February 8 4 hours a day for one week, then 6 hours a day for one week and then 8 hours as tolerated.  Pt will have a standing [employer] when returning to work.  Pt already has an ergonomic chair, Pt is beginning to add weights to exercises and is utilizing good posture during the day.  Pt given HEP for exercises to use at desk when she returns to work. Pt is making good progress.  Will probably DC next week after treatment is complete if all goals achieved   PT Next Visit Plan Gastroc stretch   and finish HEP  advanced exercises.        Problem List Patient Active Problem List   Diagnosis Date Noted  . Lumbar spondylosis 02/19/2014  . Health care maintenance 11/07/2013  . Back pain 01/23/2013  . HYPERLIPIDEMIA 03/03/2010  . Anemia, iron deficiency 03/03/2010  . DEPRESSION 03/03/2010  . Allergic rhinitis 03/03/2010  . OSTEOPENIA 03/03/2010  . Insomnia, persistent 03/03/2010   Voncille Lo, PT 04/11/2014 3:10 PM Phone: [phone] Fax: [facility] [address] [place], [place], [address] Phone: [phone]   Fax:  [phone]

## 2014-04-11 NOTE — Telephone Encounter (Signed)
Pt cancel her appts for 2/15 and 04/25/2014...td

## 2014-04-15 ENCOUNTER — Ambulatory Visit: Payer: 59 | Admitting: Physical Therapy

## 2014-04-15 DIAGNOSIS — M5416 Radiculopathy, lumbar region: Secondary | ICD-10-CM

## 2014-04-15 DIAGNOSIS — Z5189 Encounter for other specified aftercare: Secondary | ICD-10-CM | POA: Diagnosis not present

## 2014-04-15 DIAGNOSIS — Z9889 Other specified postprocedural states: Secondary | ICD-10-CM

## 2014-04-15 NOTE — Therapy (Signed)
[place], [place], [address] Phone: [phone]   Fax:  [phone]  Physical Therapy Treatment  Patient Details  Name: Sharon Barrett MRN: [id number] Date of Birth: 12-Jan-1951 Referring Provider:  Bartholomew Crews, MD  Encounter Date: 04/15/2014      PT End of Session - 04/15/14 1317    Visit Number 9   Number of Visits 12   Date for PT Re-Evaluation 04/29/14   PT Start Time 6967   PT Stop Time 0122   PT Time Calculation (min) 767 min   Activity Tolerance Patient tolerated treatment well   Behavior During Therapy [facility] for tasks assessed/performed      Past Medical History  Diagnosis Date  . Muscle spasm     takes Flexeril daily as needed  . Constipation     takes Colace daily  . Depression     takes Effexor daily  . Seasonal allergies     takes Claritin daily and uses Flonase daily as needed  . PONV (postoperative nausea and vomiting)   . Spinal headache 1979    had a spinal tap;no blood patch required  . Sinus headache     occasionally  . Arthritis     right hand  . Chronic back pain     spondylosis/radiculopathy/stenosis.  Marland Kitchen History of colon polyps     benign  . Anemia     takes Ferrous Gluconate daily  . History of blood transfusion     no abnormal reaction noted  . Cataracts, bilateral     immature   . Anxiety     takes Klonopin daily as needed  . History of shingles     Past Surgical History  Procedure Laterality Date  . Spine surgery  [id number]  . Anal fissure repair  8938    with complicated peri-rectal abscess  . Colposcopy  in 1980's    for abnormal PAP   . Bladder stricture   1966    repaired  . Tonsillectomy  1955  . Tubal ligation  1981  . Colonoscopy    . Esophagogastroduodenoscopy    . Lumbar laminectomy/decompression microdiscectomy Left 02/19/2014    Procedure: LEFT Lumbar three-four LAMINECTOMY/FORAMINOTOMY;  Surgeon: Kristeen Miss, MD;  Location: [place];  Service: Neurosurgery;  Laterality: Left;    There were no vitals taken for this visit.  Visit Diagnosis:  S/P lumbar laminectomy  Lumbar radiculopathy, chronic      Subjective Assessment - 04/15/14 1241    Symptoms Pt worked this morning for 4 hours and pain at 2/10 but was able to continue working using desk stretches.   How long can you sit comfortably? 3 hours   How long can you stand comfortably? 2 hours   How long can you walk comfortably? 1 and 1/2 hours in 30 minutes   Diagnostic tests MRI   Patient Stated Goals Return to work full time   Currently in Pain? Yes   Pain Score 2    Pain Location Back   Pain Orientation Left   Pain Descriptors / Indicators Discomfort   Pain Type Chronic pain   Pain Radiating Towards --  radiates toward knee slightly   Pain Onset More than a month ago   Pain Frequency Intermittent   Aggravating Factors  getting up and down from chair   Pain Relieving Factors stretching, medication  [place] Adult PT Treatment/Exercise - 04/15/14 0001    Posture/Postural Control   Posture/Postural Control Postural limitations   Postural Limitations Rounded Shoulders;Anterior pelvic tilt   Posture Comments Pt instructed in posture for lifting grandchildren  Pt instructed in how to lift 6 month grandchild and 3 yo   Lumbar Exercises: Stretches   Passive Hamstring Stretch --  VC   Single Knee to Chest Stretch --   Quadruped Mid Back Stretch 30 seconds;2 reps   Quadruped Mid Back Stretch Limitations none   ITB Stretch --   Lumbar Exercises: Machines for Strengthening   Leg Press 120  120LBS 12 reps 3 sets   Other Lumbar Machine Exercise --  Nustep, level 6 77minutes, 356 steps   Lumbar Exercises: Standing   Heel Raises 3 seconds  25 reps with fatigue at 20 reps bilaterally   Lumbar Exercises: Seated   Other Seated Lumbar Exercises --   Lumbar Exercises: Supine   Ab Set 10 reps;3 seconds   Bridge 10 reps;3  seconds  2 x 10   Other Supine Lumbar Exercises table top 100's  2 sets  given written handout   Lumbar Exercises: Prone   Straight Leg Raise --   Opposite Arm/Leg Raise --   Lumbar Exercises: Quadruped   Single Arm Raise 10 reps  bilaterallly with out weights   Straight Leg Raise 10 reps  with 5 lb VC   Opposite Arm/Leg Raise 10 reps  with 5 lb wt bilaterally   Knee/Hip Exercises: Stretches   Passive Hamstring Stretch --   Gastroc Stretch 30 seconds;3 reps  3 reps, 30 second hold/handout. also on 6 inch step x 2VC/TC   Soleus Stretch 30 seconds;3 reps  with VC and handout   Knee/Hip Exercises: Standing   Heel Raises 10 reps;3 seconds  x3 with  VC   Other Standing Knee Exercises gastroc stretch on step with UE support 30                 PT Education - 04/15/14 1313    Education provided Yes   Education Details hand out for [employer] stretch strengthen and 100s exercise   Person(s) Educated Patient   Methods Explanation;Demonstration;Verbal cues;Tactile cues;Handout   Comprehension Verbalized understanding;Returned demonstration          PT Short Term Goals - 04/15/14 1451    PT SHORT TERM GOAL #1   Title Pt will be independent with initial HEP   Time 6   Period Weeks   Status Achieved   PT SHORT TERM GOAL #2   Title Reduce pain from 5/10 to 3/10   Time 6   Period Weeks   Status Achieved           PT Long Term Goals - 04/15/14 1451    PT LONG TERM GOAL #1   Title "Demonstrate and verbalize techniques to reduce the risk of re-injury including: lifting, posture, body mechanics.    Time 6   Period Weeks   Status Achieved   PT LONG TERM GOAL #2   Title "Pt will tolerate sitting 1 hour without increased pain to ride in car without increased pain   Time 6   Period Weeks   Status Achieved   PT LONG TERM GOAL #3   Title Pt will improve FOTO limitationfrom  57%  to 46% indicating improved functional mobility  will test next and last visit   Time  6   Period Weeks   Status On-going  PT LONG TERM GOAL #4   Title "Pt will be independent with advanced HEP   Time 6   Period Weeks   Status On-going   PT LONG TERM GOAL #5   Title Return to work on February 8th without exacerbating pain for 4 hours in work daywithout exacerbating pain   Time 6   Period Weeks   Status Achieved   PT LONG TERM GOAL #6   Title Pt will demonstrate strategies for relieveing pain at work with full day of work eventual   Period Weeks   Status Achieved               Plan - 04/15/14 1450    Clinical Impression Statement Pt returned to work for 4 hours today and was able to use stretches at desk with Total motion to relieve discomfort.  Pt achieved LTG's 5 and 6 for work attendance and strategy for pain control at work  Pt at 2/10 pain level but able to tolerate activity and work   PT Next Visit Plan Review HEP and questions.  Pt sent FOTO by email for last visit next visit        Problem List Patient Active Problem List   Diagnosis Date Noted  . Lumbar spondylosis 02/19/2014  . Health care maintenance 11/07/2013  . Back pain 01/23/2013  . HYPERLIPIDEMIA 03/03/2010  . Anemia, iron deficiency 03/03/2010  . DEPRESSION 03/03/2010  . Allergic rhinitis 03/03/2010  . OSTEOPENIA 03/03/2010  . Insomnia, persistent 03/03/2010   Voncille Lo, PT 04/15/2014 2:56 PM Phone: [phone] Fax: [place] [facility] [address] [place], [place], [address] Phone: [phone]   Fax:  [phone]

## 2014-04-15 NOTE — Patient Instructions (Signed)
   Achilles / Gastroc, Standing   Stand, right foot behind, heel on floor and turned slightly out, leg straight, forward leg bent. Move hips forward. Hold _30_- 60 sec_ seconds. Repeat _3__ times per session. Do _3__ sessions per day.Do both sides. Keep knee straight.   Stretching: Soleus   Stand with right foot back, both knees bent. Keeping heel on floor, turned slightly out, lean into wall until stretch is felt in lower calf. Hold _30- 60___ seconds. Repeat __3__ times per set. Do __3__ sessions per day.  http://orth.exer.us/665   Gastroc / Plantar Fascia, Standing   Stand, one foot on wedge (slanted at about 30), heel resting on floor. Keep toes straight and hands on wall. With leg straight, press entire body forward. Hold _30__ seconds. Repeat _3__ times per session. Do __3_ sessions per day. If you need added stretch, you may progress as needed    http://orth.exer.us/82   Gastroc / Heel Cord Stretch - On Step   Stand with heels over edge of stair. Holding rail, lower heels until stretch is felt in calf of legs. Hold 30 secs.  Repeat __3_ times. Do __3_ times per day.  Copyright  VHI. All rights reserved.  Heel raise Exercise-  Come up on toes bilaterally for 25 consecutive times with 3 sec hold to increase strength.  May add weights for strengthening.  Addition of Hundreds Exercise       Bilateral table top with UE pulses. See Hundreds Exercise Handout form [facility].  Level 3.  100 hand pulses for a set

## 2014-04-18 ENCOUNTER — Ambulatory Visit: Payer: 59 | Admitting: Physical Therapy

## 2014-04-18 DIAGNOSIS — Z5189 Encounter for other specified aftercare: Secondary | ICD-10-CM | POA: Diagnosis not present

## 2014-04-18 DIAGNOSIS — Z9889 Other specified postprocedural states: Secondary | ICD-10-CM

## 2014-04-18 DIAGNOSIS — M5416 Radiculopathy, lumbar region: Secondary | ICD-10-CM

## 2014-04-18 NOTE — Therapy (Signed)
[facility] [address] [place], [place], [address] Phone: [phone]   Fax:  [phone]  Physical Therapy Treatment  Patient Details  Name: Sharon Barrett MRN: [id number] Date of Birth: 11/02/1950 Referring Provider:  Butcher, Elizabeth A, MD  Encounter Date: 04/18/2014      PT End of Session - 04/18/14 1203    Visit Number 10   Number of Visits 12   Date for PT Re-Evaluation 04/29/14   PT Start Time 1100   PT Stop Time 1145   PT Time Calculation (min) 45 min   Activity Tolerance Patient tolerated treatment well   Behavior During Therapy WFL for tasks assessed/performed      Past Medical History  Diagnosis Date  . Muscle spasm     takes Flexeril daily as needed  . Constipation     takes Colace daily  . Depression     takes Effexor daily  . Seasonal allergies     takes Claritin daily and uses Flonase daily as needed  . PONV (postoperative nausea and vomiting)   . Spinal headache 1979    had a spinal tap;no blood patch required  . Sinus headache     occasionally  . Arthritis     right hand  . Chronic back pain     spondylosis/radiculopathy/stenosis.  . History of colon polyps     benign  . Anemia     takes Ferrous Gluconate daily  . History of blood transfusion     no abnormal reaction noted  . Cataracts, bilateral     immature   . Anxiety     takes Klonopin daily as needed  . History of shingles     Past Surgical History  Procedure Laterality Date  . Spine surgery  1991;2005  . Anal fissure repair  1985    with complicated peri-rectal abscess  . Colposcopy  in 1980's    for abnormal PAP   . Bladder stricture   1966    repaired  . Tonsillectomy  1955  . Tubal ligation  1981  . Colonoscopy    . Esophagogastroduodenoscopy    . Lumbar laminectomy/decompression microdiscectomy Left 02/19/2014    Procedure: LEFT Lumbar three-four LAMINECTOMY/FORAMINOTOMY;  Surgeon: Henry Elsner, MD;  Location: [facility];  Service: Neurosurgery;  Laterality: Left;    There were no vitals taken for this visit.  Visit Diagnosis:  S/P lumbar laminectomy  Lumbar radiculopathy, chronic      Subjective Assessment - 04/18/14 1104    Symptoms Worked today, back pain is 1-2 discomfort but she is able to use strategies to releave   How long can you sit comfortably? 3 hours   How long can you stand comfortably? 2 hours   How long can you walk comfortably? 2 hours   Patient Stated Goals Pt is working 4 hour shifts and will be working 6 hour and eventually join in at 8 hours   Currently in Pain? Yes  Pt 1-2 at end of treatment   Pain Score 2    Pain Location Back   Pain Orientation Left   Pain Descriptors / Indicators Discomfort          [facility] PT Assessment - 04/18/14 1106    Observation/Other Assessments   Focus on Therapeutic Outcomes (FOTO)  Pt intake 57% limitation, predeicted 46% and then 37% limitation for Discharge    Posture/Postural Control   Posture Comments Pt instructed in pain management and exercise at desk   for work day form 4 to 8 hour  Pt instructed in how to lift 6 month grandchild and 3 yo   AROM   Overall AROM  Within functional limits for tasks performed  4+/5 to 5/5   Right Hip Flexion 115   Right Hip External Rotation  45   Right Hip Internal Rotation  50   Left Hip Extension 115   Left Hip External Rotation  43   Left Hip Internal Rotation  39   Lumbar Flexion 65  no pain but no further with stifness/   Lumbar Extension 19   Lumbar - Right Side Bend 26   Lumbar - Left Side Bend 23   Lumbar - Right Rotation WFL   Lumbar - Left Rotation WFL                  [facility] Adult PT Treatment/Exercise - 04/18/14 1106    Posture/Postural Control   Posture/Postural Control Postural limitations   Postural Limitations Rounded Shoulders;Anterior pelvic tilt   Lumbar Exercises: Stretches   Passive Hamstring Stretch --  VC   Single Knee to Chest Stretch 5 reps   5sec hold   Quadruped Mid Back Stretch 30 seconds;2 reps   Quadruped Mid Back Stretch Limitations none   ITB Stretch 60 seconds;2 reps  Pt with left knee pain medial relieved with stretch   Lumbar Exercises: Machines for Strengthening   Other Lumbar Machine Exercise --  Nustep, level 6 5minutes, 356 steps   Lumbar Exercises: Supine   Ab Set 10 reps;3 seconds   Bridge 10 reps;3 seconds  2 x 10   Other Supine Lumbar Exercises table top 100's  2 sets  given written handout   Other Supine Lumbar Exercises table top with arm extension with Blue Tband 2 x 10   Lumbar Exercises: Sidelying   Clam 10 reps  x3 with Red t band   Lumbar Exercises: Quadruped   Single Arm Raise 10 reps  bilaterallly with out weights   Straight Leg Raise 10 reps  with 5 lb VC   Opposite Arm/Leg Raise 10 reps  with 5 lb wt bilaterally   Knee/Hip Exercises: Supine   Quad Sets 15 reps                  PT Short Term Goals - 04/18/14 1206    PT SHORT TERM GOAL #1   Title Pt will be independent with initial HEP   Time 6   Period Weeks   Status Achieved   PT SHORT TERM GOAL #2   Title Reduce pain from 5/10 to 3/10   Time 6   Period Weeks   Status Achieved           PT Long Term Goals - 04/18/14 1142    PT LONG TERM GOAL #1   Title "Demonstrate and verbalize techniques to reduce the risk of re-injury including: lifting, posture, body mechanics.    Time 6   Period Weeks   Status Achieved   PT LONG TERM GOAL #2   Title "Pt will tolerate sitting 1 hour without increased pain to ride in car without increased pain   Time 6   Period Weeks   Status Achieved   PT LONG TERM GOAL #3   Title Pt will improve FOTO limitationfrom  57%  to 46% indicating improved functional mobility   Time 6   Period Weeks   Status Achieved   PT LONG TERM GOAL #4     Title "Pt will be independent with advanced HEP   Time 6   Period Weeks   Status Achieved   PT LONG TERM GOAL #5   Title Return to work on  February 8th without exacerbating pain for 4 hours in work daywithout exacerbating pain   Time 6   Period Weeks   Status Achieved   PT LONG TERM GOAL #6   Title Pt will demonstrate strategies for relieveing pain at work with full day of work eventual   Time 6   Period Weeks   Status Achieved               Plan - 04/18/14 1203    Clinical Impression Statement Pt has achieved all goal STG and LTG.  Pt has home exercise program and is aware of how to progress program.  Pt encouraged to continue community wellness for health.  Pt with residual  1-2/10 discomfort but is able to use posture/body mechanics and stretches to  relieve at work situation.  Pt reviewed Home exercies and had questions answered.  Pt with increased AROM in hips and low back for AROM .  Pt with 4=/5 to 5/5/ strength through out        Problem List Patient Active Problem List   Diagnosis Date Noted  . Lumbar spondylosis 02/19/2014  . Health care maintenance 11/07/2013  . Back pain 01/23/2013  . HYPERLIPIDEMIA 03/03/2010  . Anemia, iron deficiency 03/03/2010  . DEPRESSION 03/03/2010  . Allergic rhinitis 03/03/2010  . OSTEOPENIA 03/03/2010  . Insomnia, persistent 03/03/2010   Lawrie Beardsley, PT 04/18/2014 12:14 PM Phone: [phone] Fax: [phone]  [facility] [address] [place], [place], [address] Phone: [phone]   Fax:  [phone]   PHYSICAL THERAPY DISCHARGE SUMMARY  Visits from Start of Care: 10  Current functional level related to goals / functional outcomes: See goals above.  All goal achieved. Pt with 1-2/10 pain/discomfort but able to do exercise and use pain management strategies for working 4 hour day.   Remaining deficits: 1 to 2 /10 pain level ,  Pt with home exercise program and knowledge of how to progress    Education / Equipment: HEP, Blue and Red Tband,  Posture and body mechanics, Work station  ergonomics Plan: Patient agrees to discharge.  Patient goals were met. Patient is being discharged due to meeting the stated rehab goals.  ????? and being pleased with current functional level          

## 2014-04-22 ENCOUNTER — Encounter: Payer: 59 | Admitting: Physical Therapy

## 2014-04-25 ENCOUNTER — Encounter: Payer: 59 | Admitting: Physical Therapy

## 2014-06-03 ENCOUNTER — Other Ambulatory Visit: Payer: Self-pay | Admitting: Internal Medicine

## 2014-08-21 ENCOUNTER — Telehealth: Payer: Self-pay | Admitting: Internal Medicine

## 2014-08-21 NOTE — Telephone Encounter (Signed)
Call to patient to confirm appointment for 6/16 at 9:15 lmtcb

## 2014-08-22 ENCOUNTER — Ambulatory Visit (INDEPENDENT_AMBULATORY_CARE_PROVIDER_SITE_OTHER): Payer: 59 | Admitting: Internal Medicine

## 2014-08-22 ENCOUNTER — Encounter: Payer: Self-pay | Admitting: Internal Medicine

## 2014-08-22 VITALS — BP 160/66 | HR 63 | Temp 98.2°F | Wt 170.3 lb

## 2014-08-22 DIAGNOSIS — D509 Iron deficiency anemia, unspecified: Secondary | ICD-10-CM

## 2014-08-22 DIAGNOSIS — M8580 Other specified disorders of bone density and structure, unspecified site: Secondary | ICD-10-CM | POA: Diagnosis not present

## 2014-08-22 DIAGNOSIS — G4709 Other insomnia: Secondary | ICD-10-CM

## 2014-08-22 DIAGNOSIS — J301 Allergic rhinitis due to pollen: Secondary | ICD-10-CM

## 2014-08-22 DIAGNOSIS — M545 Low back pain: Secondary | ICD-10-CM

## 2014-08-22 DIAGNOSIS — F329 Major depressive disorder, single episode, unspecified: Secondary | ICD-10-CM

## 2014-08-22 DIAGNOSIS — F32A Depression, unspecified: Secondary | ICD-10-CM

## 2014-08-22 DIAGNOSIS — M549 Dorsalgia, unspecified: Secondary | ICD-10-CM | POA: Diagnosis not present

## 2014-08-22 DIAGNOSIS — G4700 Insomnia, unspecified: Secondary | ICD-10-CM

## 2014-08-22 DIAGNOSIS — Z7982 Long term (current) use of aspirin: Secondary | ICD-10-CM

## 2014-08-22 DIAGNOSIS — Z0000 Encounter for general adult medical examination without abnormal findings: Secondary | ICD-10-CM

## 2014-08-22 DIAGNOSIS — Z79899 Other long term (current) drug therapy: Secondary | ICD-10-CM

## 2014-08-22 DIAGNOSIS — J309 Allergic rhinitis, unspecified: Secondary | ICD-10-CM

## 2014-08-22 MED ORDER — VENLAFAXINE HCL 25 MG PO TABS: 25.0000 mg | ORAL_TABLET | Freq: Every day | ORAL | Status: AC

## 2014-08-22 MED ORDER — ASPIRIN EC 81 MG PO TBEC: 81.0000 mg | DELAYED_RELEASE_TABLET | Freq: Every day | ORAL | Status: AC

## 2014-08-22 MED ORDER — DOCUSATE SODIUM 100 MG PO CAPS: 200.0000 mg | ORAL_CAPSULE | Freq: Every day | ORAL | Status: AC

## 2014-08-22 MED ORDER — CALCIUM CARBONATE 600 MG PO TABS: 1200.0000 mg | ORAL_TABLET | Freq: Every day | ORAL | Status: AC

## 2014-08-22 MED ORDER — CLONAZEPAM 0.5 MG PO TABS: 0.5000 mg | ORAL_TABLET | Freq: Two times a day (BID) | ORAL | Status: AC | PRN

## 2014-08-22 MED ORDER — LORATADINE 10 MG PO CAPS: 10.0000 mg | ORAL_CAPSULE | Freq: Every day | ORAL | Status: AC

## 2014-08-22 MED ORDER — SENIOR MULTIVITAMIN PLUS PO TABS: 1.0000 | ORAL_TABLET | Freq: Every day | ORAL | Status: AC

## 2014-08-22 MED ORDER — FERROUS SULFATE 325 (65 FE) MG PO TABS: 325.0000 mg | ORAL_TABLET | Freq: Three times a day (TID) | ORAL | Status: AC

## 2014-08-22 MED ORDER — VITAMIN D3 50 MCG (2000 UT) PO CAPS: 2000.0000 [IU] | ORAL_CAPSULE | Freq: Every day | ORAL | Status: AC

## 2014-08-22 MED ORDER — PHENYLEPHRINE HCL 10 MG PO TABS: 10.0000 mg | ORAL_TABLET | ORAL | Status: AC | PRN

## 2014-08-22 MED ORDER — FLUTICASONE PROPIONATE 50 MCG/ACT NA SUSP: NASAL | Status: AC

## 2014-08-22 MED ORDER — SALINE SPRAY 0.65 % NA SOLN: 2.0000 | NASAL | Status: AC | PRN

## 2014-08-22 NOTE — Assessment & Plan Note (Signed)
Doing quite well after surgery. Off IBU, flexeril, valium, and opioids. Taking it easy while packing - resting often.

## 2014-08-22 NOTE — Progress Notes (Signed)
   Subjective:    Patient ID: Sharon Barrett, female    DOB: 11-18-1950, 64 y.o.   MRN: [id number]  HPI  Sharon Barrett comes today for refill appt. This is her last appt prior to moving to [facility]. She retired June 1st.   Review of Systems  Constitutional: Negative for unexpected weight change.  HENT: Negative for rhinorrhea, sinus pressure and sneezing.   Eyes: Negative for itching.  Cardiovascular: Negative for leg swelling.  Musculoskeletal: Negative for back pain and gait problem.  Psychiatric/Behavioral: Positive for sleep disturbance.       Objective:   Physical Exam  Constitutional: She is oriented to person, place, and time. She appears well-developed and well-nourished. No distress.  HENT:  Head: Normocephalic and atraumatic.  Right Ear: External ear normal.  Left Ear: External ear normal.  Nose: Nose normal.  Eyes: Conjunctivae and EOM are normal.  Cardiovascular: Normal rate, regular rhythm and normal heart sounds.   No murmur heard. Pulmonary/Chest: Effort normal and breath sounds normal.  Musculoskeletal: Normal range of motion. She exhibits no edema.  Neurological: She is alert and oriented to person, place, and time.  Skin: Skin is warm and dry. She is not diaphoretic.  Psychiatric: She has a normal mood and affect. Her behavior is normal. Judgment and thought content normal.          Assessment & Plan:

## 2014-08-22 NOTE — Assessment & Plan Note (Signed)
Not flaring now - depends on season. Cont on flonase, claritin, and aline nose spray. Sudafed is prn now.

## 2014-08-22 NOTE — Assessment & Plan Note (Signed)
On FeSO4 QD. Ferritin increased from 5 to 40 (11/15). HgB stable at about 11.5 ish. Cont iron.

## 2014-08-22 NOTE — Assessment & Plan Note (Signed)
Now retired, walking couple of miles Q AM with friends. BP elevated today but I forgot to repeat. Prior BP acceptable.  BP Readings from Last 3 Encounters:  02/20/14 141/47  02/14/14 146/69  01/09/14 137/63   Taking MVI QD. Taking stool softer QD. On ASA 81 preventative. 10 yr CHD risk < 10% LDL 118 04/2013. I told her I would refill meds for up to one yr until she found new MD in [place].

## 2014-08-22 NOTE — Assessment & Plan Note (Signed)
Doing very well on her effexor 25 QD. Had been on QID but with increased stress of moving, increased back up to QD. No side effects.

## 2014-08-22 NOTE — Assessment & Plan Note (Signed)
Cont to take Clonazepam 00.5 at bedtime. Gets 5-6 hrs sleep - always wakes at 4 AM and too late to repeat dose. Never naps during day. Med refilled as benefits outweigh risks.

## 2014-08-22 NOTE — Assessment & Plan Note (Signed)
We discussed that she would need a DEXA but that it would be best to wait until she est with her geriatrician in [place]. On CaCO2 2000 per day and Vit D 2000 IU QD. Reviewed chart and can find no prior DEXA that would support the dx of osteopenia. Had been placed on PL by prior PCP. Dx will be est or refuted when gets DEXA in [place].

## 2016-10-12 IMAGING — CR DG LUMBAR SPINE 1V
1 series · 1 of 1 positions shown · non-contrast
Comparison: Portable cross-table lateral intraoperative image #1 at
2306 hr compared to 01/30/2014 and correlated with prior MRI lumbar
spine of 01/22/2014.

CLINICAL DATA: LEFT L3-L4 lumbar laminectomy/foraminotomy

EXAM:
LUMBAR SPINE - 1 VIEW

[lat]
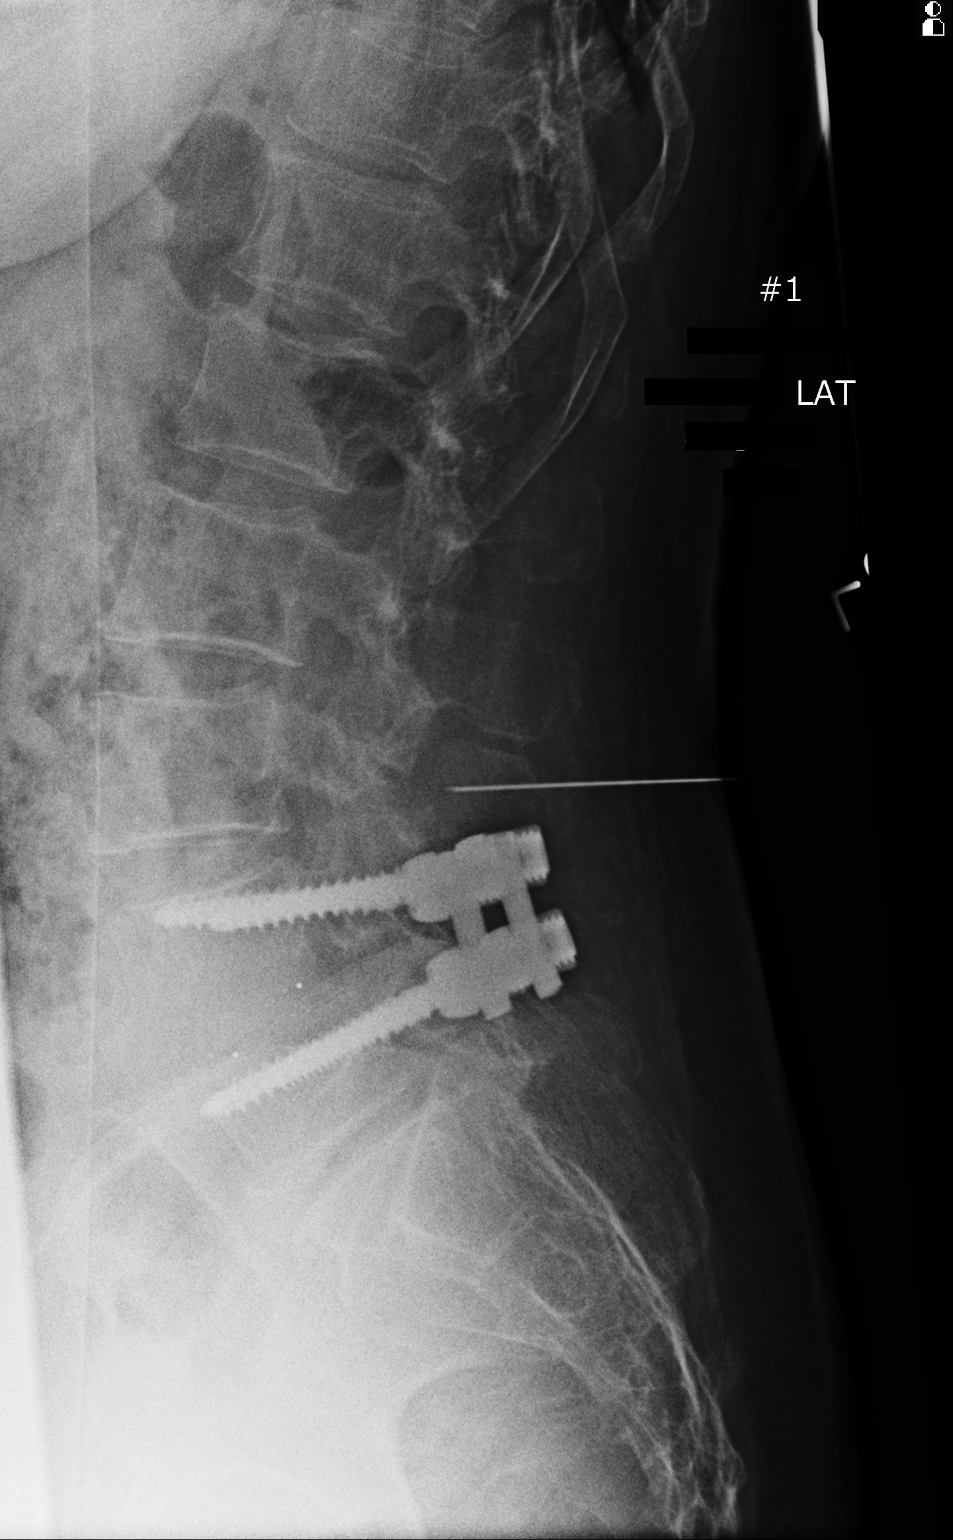

[1 of 1 positions shown; findings below may reference images not displayed]

FINDINGS: Five lumbar vertebrae labeled on prior MR.

Prior posterior fusion L4-L5 with BILATERAL pedicle screws and bars.

Single metallic probe from dorsal approach projects dorsal to the
inferior endplate of L3.

Bones demineralized.
IMPRESSION: Dorsal intraoperative localization of the inferior L3 level.
